# Patient Record
Sex: Male | Born: 1990 | Race: Black or African American | Hispanic: No | Marital: Single | Smoking: Never smoker
Health system: Southern US, Community
[De-identification: ages and names within clinical notes are randomized; demographics above are authoritative.]

## PROBLEM LIST (undated history)

## (undated) DIAGNOSIS — N319 Neuromuscular dysfunction of bladder, unspecified: Secondary | ICD-10-CM

## (undated) DIAGNOSIS — R339 Retention of urine, unspecified: Secondary | ICD-10-CM

## (undated) DIAGNOSIS — G8929 Other chronic pain: Secondary | ICD-10-CM

## (undated) DIAGNOSIS — I10 Essential (primary) hypertension: Secondary | ICD-10-CM

## (undated) DIAGNOSIS — K592 Neurogenic bowel, not elsewhere classified: Secondary | ICD-10-CM

## (undated) DIAGNOSIS — Q059 Spina bifida, unspecified: Secondary | ICD-10-CM

## (undated) DIAGNOSIS — M549 Dorsalgia, unspecified: Secondary | ICD-10-CM

## (undated) HISTORY — PX: CSF SHUNT: SHX92

## (undated) HISTORY — PX: BACK SURGERY: SHX140

---
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
## 2008-03-15 ENCOUNTER — Emergency Department (HOSPITAL_COMMUNITY): Admission: EM | Admit: 2008-03-15 | Discharge: 2008-03-15 | Payer: Self-pay | Admitting: Emergency Medicine

## 2008-06-11 ENCOUNTER — Emergency Department (HOSPITAL_BASED_OUTPATIENT_CLINIC_OR_DEPARTMENT_OTHER): Admission: EM | Admit: 2008-06-11 | Discharge: 2008-06-11 | Payer: Self-pay | Admitting: Emergency Medicine

## 2011-07-04 LAB — DIFFERENTIAL
Eosinophils Absolute: 0
Eosinophils Relative: 0
Lymphocytes Relative: 18
Lymphs Abs: 1.2
Monocytes Absolute: 1.3 — ABNORMAL HIGH
Monocytes Relative: 20 — ABNORMAL HIGH

## 2011-07-04 LAB — CBC
HCT: 41.6
Hemoglobin: 13.9
MCV: 82.7
RBC: 5.03
WBC: 6.6

## 2011-07-04 LAB — POCT I-STAT, CHEM 8
BUN: 9
Creatinine, Ser: 1.4
Glucose, Bld: 88
Hemoglobin: 15.3
TCO2: 25

## 2011-07-10 LAB — DIFFERENTIAL
Basophils Absolute: 0.6 — ABNORMAL HIGH
Basophils Relative: 4 — ABNORMAL HIGH
Eosinophils Absolute: 0
Eosinophils Relative: 0
Lymphocytes Relative: 4 — ABNORMAL LOW
Lymphs Abs: 0.6 — ABNORMAL LOW
Monocytes Absolute: 0.9
Monocytes Relative: 6
Neutro Abs: 12.6 — ABNORMAL HIGH
Neutrophils Relative %: 86 — ABNORMAL HIGH

## 2011-07-10 LAB — CBC
HCT: 39.3
Hemoglobin: 13.8
MCHC: 35
MCV: 80.7
Platelets: 229
RBC: 4.87
RDW: 13.1
WBC: 14.7 — ABNORMAL HIGH

## 2011-07-10 LAB — URINALYSIS, ROUTINE W REFLEX MICROSCOPIC
Glucose, UA: NEGATIVE
Ketones, ur: 15 — AB
Nitrite: NEGATIVE
Protein, ur: 100 — AB
Specific Gravity, Urine: 1.036 — ABNORMAL HIGH
Urobilinogen, UA: 1
pH: 6

## 2011-07-10 LAB — URINE MICROSCOPIC-ADD ON

## 2011-07-10 LAB — URINE CULTURE: Colony Count: >=100000

## 2011-07-10 LAB — POCT B-TYPE NATRIURETIC PEPTIDE (BNP): B Natriuretic Peptide, POC: <5

## 2017-07-18 ENCOUNTER — Inpatient Hospital Stay (HOSPITAL_BASED_OUTPATIENT_CLINIC_OR_DEPARTMENT_OTHER)
Admission: EM | Admit: 2017-07-18 | Discharge: 2017-07-22 | DRG: 872 | Disposition: A | Discharge status: Home / Self Care | Payer: PRIVATE HEALTH INSURANCE | Attending: Internal Medicine | Admitting: Internal Medicine

## 2017-07-18 ENCOUNTER — Emergency Department (HOSPITAL_BASED_OUTPATIENT_CLINIC_OR_DEPARTMENT_OTHER): Payer: PRIVATE HEALTH INSURANCE

## 2017-07-18 ENCOUNTER — Encounter (HOSPITAL_BASED_OUTPATIENT_CLINIC_OR_DEPARTMENT_OTHER): Payer: Self-pay | Admitting: *Deleted

## 2017-07-18 DIAGNOSIS — N390 Urinary tract infection, site not specified: Secondary | ICD-10-CM | POA: Diagnosis present

## 2017-07-18 DIAGNOSIS — Z982 Presence of cerebrospinal fluid drainage device: Secondary | ICD-10-CM

## 2017-07-18 DIAGNOSIS — I10 Essential (primary) hypertension: Secondary | ICD-10-CM | POA: Diagnosis present

## 2017-07-18 DIAGNOSIS — A4151 Sepsis due to Escherichia coli [E. coli]: Principal | ICD-10-CM | POA: Diagnosis present

## 2017-07-18 DIAGNOSIS — Z87728 Personal history of other specified (corrected) congenital malformations of nervous system and sense organs: Secondary | ICD-10-CM

## 2017-07-18 DIAGNOSIS — R51 Headache: Secondary | ICD-10-CM | POA: Diagnosis present

## 2017-07-18 DIAGNOSIS — N319 Neuromuscular dysfunction of bladder, unspecified: Secondary | ICD-10-CM | POA: Diagnosis present

## 2017-07-18 DIAGNOSIS — M545 Low back pain: Secondary | ICD-10-CM | POA: Diagnosis present

## 2017-07-18 DIAGNOSIS — N529 Male erectile dysfunction, unspecified: Secondary | ICD-10-CM | POA: Diagnosis present

## 2017-07-18 DIAGNOSIS — Z91040 Latex allergy status: Secondary | ICD-10-CM

## 2017-07-18 DIAGNOSIS — H538 Other visual disturbances: Secondary | ICD-10-CM | POA: Diagnosis present

## 2017-07-18 DIAGNOSIS — Z79899 Other long term (current) drug therapy: Secondary | ICD-10-CM

## 2017-07-18 DIAGNOSIS — G8929 Other chronic pain: Secondary | ICD-10-CM | POA: Diagnosis present

## 2017-07-18 DIAGNOSIS — Z833 Family history of diabetes mellitus: Secondary | ICD-10-CM

## 2017-07-18 DIAGNOSIS — R42 Dizziness and giddiness: Secondary | ICD-10-CM | POA: Diagnosis present

## 2017-07-18 DIAGNOSIS — Z886 Allergy status to analgesic agent status: Secondary | ICD-10-CM

## 2017-07-18 DIAGNOSIS — Q0701 Arnold-Chiari syndrome with spina bifida: Secondary | ICD-10-CM

## 2017-07-18 DIAGNOSIS — A419 Sepsis, unspecified organism: Secondary | ICD-10-CM

## 2017-07-18 HISTORY — DX: Essential (primary) hypertension: I10

## 2017-07-18 HISTORY — DX: Neurogenic bowel, not elsewhere classified: K59.2

## 2017-07-18 HISTORY — DX: Dorsalgia, unspecified: M54.9

## 2017-07-18 HISTORY — DX: Spina bifida, unspecified: Q05.9

## 2017-07-18 HISTORY — DX: Other chronic pain: G89.29

## 2017-07-18 HISTORY — DX: Retention of urine, unspecified: R33.9

## 2017-07-18 HISTORY — DX: Neuromuscular dysfunction of bladder, unspecified: N31.9

## 2017-07-18 LAB — URINALYSIS, ROUTINE W REFLEX MICROSCOPIC
BILIRUBIN URINE: NEGATIVE
Glucose, UA: NEGATIVE mg/dL
Ketones, ur: >80 mg/dL — AB
Nitrite: POSITIVE — AB
Protein, ur: NEGATIVE mg/dL
SPECIFIC GRAVITY, URINE: 1.015 (ref 1.005–1.030)
pH: 8 (ref 5.0–8.0)

## 2017-07-18 LAB — CBC WITH DIFFERENTIAL/PLATELET
Basophils Absolute: 0 10*3/uL (ref 0.0–0.1)
Basophils Relative: 0 %
EOS ABS: 0.1 10*3/uL (ref 0.0–0.7)
EOS PCT: 1 %
HCT: 38.9 % — ABNORMAL LOW (ref 39.0–52.0)
Hemoglobin: 13.1 g/dL (ref 13.0–17.0)
LYMPHS ABS: 0.6 10*3/uL — AB (ref 0.7–4.0)
Lymphocytes Relative: 4 %
MCH: 27.4 pg (ref 26.0–34.0)
MCHC: 33.7 g/dL (ref 30.0–36.0)
MCV: 81.4 fL (ref 78.0–100.0)
MONO ABS: 1 10*3/uL (ref 0.1–1.0)
MONOS PCT: 7 %
Neutro Abs: 12.8 10*3/uL — ABNORMAL HIGH (ref 1.7–7.7)
Neutrophils Relative %: 88 %
PLATELETS: 252 10*3/uL (ref 150–400)
RBC: 4.78 MIL/uL (ref 4.22–5.81)
RDW: 14.9 % (ref 11.5–15.5)
WBC: 14.4 10*3/uL — AB (ref 4.0–10.5)

## 2017-07-18 LAB — URINALYSIS, MICROSCOPIC (REFLEX)

## 2017-07-18 LAB — COMPREHENSIVE METABOLIC PANEL
ALT: 18 U/L (ref 17–63)
ANION GAP: 8 (ref 5–15)
AST: 22 U/L (ref 15–41)
Albumin: 4.1 g/dL (ref 3.5–5.0)
Alkaline Phosphatase: 92 U/L (ref 38–126)
BUN: 12 mg/dL (ref 6–20)
CHLORIDE: 111 mmol/L (ref 101–111)
CO2: 18 mmol/L — ABNORMAL LOW (ref 22–32)
CREATININE: 1.17 mg/dL (ref 0.61–1.24)
Calcium: 9.2 mg/dL (ref 8.9–10.3)
Glucose, Bld: 98 mg/dL (ref 65–99)
POTASSIUM: 3.5 mmol/L (ref 3.5–5.1)
Sodium: 137 mmol/L (ref 135–145)
Total Bilirubin: 1.2 mg/dL (ref 0.3–1.2)
Total Protein: 8.2 g/dL — ABNORMAL HIGH (ref 6.5–8.1)

## 2017-07-18 LAB — PROTIME-INR
INR: 1.19
PROTHROMBIN TIME: 15 s (ref 11.4–15.2)

## 2017-07-18 LAB — I-STAT CG4 LACTIC ACID, ED: LACTIC ACID, VENOUS: 1.6 mmol/L (ref 0.5–1.9)

## 2017-07-18 MED ORDER — SODIUM CHLORIDE 0.9 % IV BOLUS (SEPSIS)
30.0000 mL/kg | Freq: Once | INTRAVENOUS | Status: AC
  Administered 2017-07-18: 2586 mL via INTRAVENOUS

## 2017-07-18 MED ORDER — PIPERACILLIN-TAZOBACTAM 3.375 G IVPB 30 MIN
3.3750 g | Freq: Once | INTRAVENOUS | Status: AC
  Administered 2017-07-18: 3.375 g via INTRAVENOUS
  Filled 2017-07-18 (×2): qty 50

## 2017-07-18 MED ORDER — VANCOMYCIN HCL IN DEXTROSE 1-5 GM/200ML-% IV SOLN
1000.0000 mg | Freq: Once | INTRAVENOUS | Status: AC
Start: 2017-07-18 — End: 2017-07-19
  Administered 2017-07-18: 1000 mg via INTRAVENOUS
  Filled 2017-07-18: qty 200

## 2017-07-18 NOTE — ED Triage Notes (Signed)
Pt c/o fever chills and "UTI" x 1 day  HX spina bifida

## 2017-07-19 ENCOUNTER — Inpatient Hospital Stay (HOSPITAL_COMMUNITY): Payer: PRIVATE HEALTH INSURANCE

## 2017-07-19 ENCOUNTER — Encounter (HOSPITAL_COMMUNITY): Payer: Self-pay | Admitting: Internal Medicine

## 2017-07-19 DIAGNOSIS — N390 Urinary tract infection, site not specified: Secondary | ICD-10-CM

## 2017-07-19 DIAGNOSIS — H538 Other visual disturbances: Secondary | ICD-10-CM | POA: Diagnosis present

## 2017-07-19 DIAGNOSIS — Z833 Family history of diabetes mellitus: Secondary | ICD-10-CM | POA: Diagnosis not present

## 2017-07-19 DIAGNOSIS — A4151 Sepsis due to Escherichia coli [E. coli]: Secondary | ICD-10-CM | POA: Diagnosis present

## 2017-07-19 DIAGNOSIS — N529 Male erectile dysfunction, unspecified: Secondary | ICD-10-CM | POA: Diagnosis present

## 2017-07-19 DIAGNOSIS — Q0701 Arnold-Chiari syndrome with spina bifida: Secondary | ICD-10-CM | POA: Diagnosis not present

## 2017-07-19 DIAGNOSIS — Z79899 Other long term (current) drug therapy: Secondary | ICD-10-CM | POA: Diagnosis not present

## 2017-07-19 DIAGNOSIS — G8929 Other chronic pain: Secondary | ICD-10-CM | POA: Diagnosis present

## 2017-07-19 DIAGNOSIS — R51 Headache: Secondary | ICD-10-CM | POA: Diagnosis present

## 2017-07-19 DIAGNOSIS — A419 Sepsis, unspecified organism: Secondary | ICD-10-CM | POA: Diagnosis not present

## 2017-07-19 DIAGNOSIS — Z886 Allergy status to analgesic agent status: Secondary | ICD-10-CM | POA: Diagnosis not present

## 2017-07-19 DIAGNOSIS — Z982 Presence of cerebrospinal fluid drainage device: Secondary | ICD-10-CM | POA: Diagnosis not present

## 2017-07-19 DIAGNOSIS — Z87728 Personal history of other specified (corrected) congenital malformations of nervous system and sense organs: Secondary | ICD-10-CM | POA: Diagnosis not present

## 2017-07-19 DIAGNOSIS — N319 Neuromuscular dysfunction of bladder, unspecified: Secondary | ICD-10-CM | POA: Diagnosis present

## 2017-07-19 DIAGNOSIS — I10 Essential (primary) hypertension: Secondary | ICD-10-CM | POA: Diagnosis present

## 2017-07-19 DIAGNOSIS — R42 Dizziness and giddiness: Secondary | ICD-10-CM | POA: Diagnosis present

## 2017-07-19 DIAGNOSIS — M545 Low back pain: Secondary | ICD-10-CM | POA: Diagnosis present

## 2017-07-19 DIAGNOSIS — Z91040 Latex allergy status: Secondary | ICD-10-CM | POA: Diagnosis not present

## 2017-07-19 MED ORDER — CELECOXIB 100 MG PO CAPS
100.0000 mg | ORAL_CAPSULE | Freq: Two times a day (BID) | ORAL | Status: DC
  Administered 2017-07-19 – 2017-07-20 (×3): 100 mg via ORAL
  Filled 2017-07-19 (×5): qty 1

## 2017-07-19 MED ORDER — BACLOFEN 20 MG PO TABS
20.0000 mg | ORAL_TABLET | Freq: Two times a day (BID) | ORAL | Status: DC
  Administered 2017-07-19 – 2017-07-22 (×6): 20 mg via ORAL
  Filled 2017-07-19 (×6): qty 1

## 2017-07-19 MED ORDER — HYDROMORPHONE HCL 1 MG/ML IJ SOLN
1.0000 mg | INTRAMUSCULAR | Status: DC | PRN
  Administered 2017-07-19: 2 mg via INTRAVENOUS
  Filled 2017-07-19: qty 2

## 2017-07-19 MED ORDER — LISINOPRIL 20 MG PO TABS
20.0000 mg | ORAL_TABLET | Freq: Every day | ORAL | Status: DC
  Administered 2017-07-19 – 2017-07-22 (×4): 20 mg via ORAL
  Filled 2017-07-19 (×4): qty 1

## 2017-07-19 MED ORDER — FENTANYL CITRATE (PF) 100 MCG/2ML IJ SOLN
50.0000 ug | INTRAMUSCULAR | Status: DC | PRN
  Administered 2017-07-19 – 2017-07-20 (×3): 50 ug via INTRAVENOUS
  Filled 2017-07-19 (×3): qty 2

## 2017-07-19 MED ORDER — PREGABALIN 75 MG PO CAPS
75.0000 mg | ORAL_CAPSULE | Freq: Two times a day (BID) | ORAL | Status: DC
  Administered 2017-07-19: 75 mg via ORAL
  Filled 2017-07-19: qty 1

## 2017-07-19 MED ORDER — BACLOFEN 20 MG PO TABS
20.0000 mg | ORAL_TABLET | Freq: Three times a day (TID) | ORAL | Status: DC
  Administered 2017-07-19: 20 mg via ORAL
  Filled 2017-07-19: qty 1

## 2017-07-19 MED ORDER — FENTANYL CITRATE (PF) 100 MCG/2ML IJ SOLN
50.0000 ug | Freq: Once | INTRAMUSCULAR | Status: AC
  Administered 2017-07-19: 50 ug via INTRAVENOUS

## 2017-07-19 MED ORDER — ACETAMINOPHEN 325 MG PO TABS: 650.0000 mg | ORAL_TABLET | Freq: Four times a day (QID) | ORAL | Status: DC | PRN

## 2017-07-19 MED ORDER — OXYBUTYNIN CHLORIDE ER 5 MG PO TB24: 15.0000 mg | ORAL_TABLET | Freq: Every day | ORAL | Status: DC

## 2017-07-19 MED ORDER — VANCOMYCIN HCL IN DEXTROSE 1-5 GM/200ML-% IV SOLN
1000.0000 mg | Freq: Two times a day (BID) | INTRAVENOUS | Status: DC
  Administered 2017-07-19 – 2017-07-21 (×5): 1000 mg via INTRAVENOUS
  Filled 2017-07-19 (×4): qty 200

## 2017-07-19 MED ORDER — SACCHAROMYCES BOULARDII 250 MG PO CAPS
250.0000 mg | ORAL_CAPSULE | Freq: Two times a day (BID) | ORAL | Status: DC
  Administered 2017-07-19 – 2017-07-22 (×7): 250 mg via ORAL
  Filled 2017-07-19 (×7): qty 1

## 2017-07-19 MED ORDER — SODIUM CHLORIDE 0.9 % IV SOLN
INTRAVENOUS | Status: AC
  Administered 2017-07-19 – 2017-07-20 (×3): via INTRAVENOUS

## 2017-07-19 MED ORDER — TOPIRAMATE 25 MG PO TABS
50.0000 mg | ORAL_TABLET | Freq: Two times a day (BID) | ORAL | Status: DC
  Administered 2017-07-19 – 2017-07-22 (×7): 50 mg via ORAL
  Filled 2017-07-19 (×7): qty 2

## 2017-07-19 MED ORDER — POLYETHYLENE GLYCOL 3350 17 G PO PACK: 17.0000 g | PACK | Freq: Every day | ORAL | Status: DC | PRN

## 2017-07-19 MED ORDER — HYDROMORPHONE HCL 1 MG/ML IJ SOLN
1.0000 mg | INTRAMUSCULAR | Status: DC | PRN
  Administered 2017-07-19 (×2): 1 mg via INTRAVENOUS
  Filled 2017-07-19 (×2): qty 1

## 2017-07-19 MED ORDER — PIPERACILLIN-TAZOBACTAM 3.375 G IVPB
3.3750 g | Freq: Three times a day (TID) | INTRAVENOUS | Status: DC
  Administered 2017-07-19 – 2017-07-22 (×10): 3.375 g via INTRAVENOUS
  Filled 2017-07-19 (×10): qty 50

## 2017-07-19 MED ORDER — DEXTROSE 5 % IV SOLN: 1.0000 g | INTRAVENOUS | Status: DC

## 2017-07-19 MED ORDER — DIPHENHYDRAMINE HCL 50 MG/ML IJ SOLN: 25.0000 mg | Freq: Four times a day (QID) | INTRAMUSCULAR | Status: DC | PRN

## 2017-07-19 MED ORDER — ACETAMINOPHEN 325 MG PO TABS
ORAL_TABLET | ORAL | Status: AC
Start: 2017-07-19 — End: 2017-07-19
  Filled 2017-07-19: qty 2

## 2017-07-19 MED ORDER — FENTANYL CITRATE (PF) 100 MCG/2ML IJ SOLN
50.0000 ug | Freq: Once | INTRAMUSCULAR | Status: AC
  Administered 2017-07-19: 50 ug via INTRAVENOUS
  Filled 2017-07-19: qty 2

## 2017-07-19 MED ORDER — ACETAMINOPHEN 650 MG RE SUPP: 650.0000 mg | Freq: Four times a day (QID) | RECTAL | Status: DC | PRN

## 2017-07-19 MED ORDER — FENTANYL CITRATE (PF) 100 MCG/2ML IJ SOLN
INTRAMUSCULAR | Status: AC
  Filled 2017-07-19: qty 2

## 2017-07-19 MED ORDER — PREGABALIN 75 MG PO CAPS
75.0000 mg | ORAL_CAPSULE | Freq: Three times a day (TID) | ORAL | Status: DC
  Administered 2017-07-19 – 2017-07-22 (×9): 75 mg via ORAL
  Filled 2017-07-19 (×9): qty 1

## 2017-07-19 MED ORDER — DULOXETINE HCL 60 MG PO CPEP
60.0000 mg | ORAL_CAPSULE | Freq: Every day | ORAL | Status: DC
  Administered 2017-07-19 – 2017-07-22 (×4): 60 mg via ORAL
  Filled 2017-07-19 (×4): qty 1

## 2017-07-19 MED ORDER — ONDANSETRON HCL 4 MG/2ML IJ SOLN
4.0000 mg | Freq: Four times a day (QID) | INTRAMUSCULAR | Status: DC | PRN
  Administered 2017-07-19: 4 mg via INTRAVENOUS
  Filled 2017-07-19: qty 2

## 2017-07-19 MED ORDER — ENOXAPARIN SODIUM 40 MG/0.4ML ~~LOC~~ SOLN
40.0000 mg | SUBCUTANEOUS | Status: DC
  Administered 2017-07-19 – 2017-07-22 (×4): 40 mg via SUBCUTANEOUS
  Filled 2017-07-19 (×4): qty 0.4

## 2017-07-19 MED ORDER — OXYBUTYNIN CHLORIDE ER 5 MG PO TB24
15.0000 mg | ORAL_TABLET | Freq: Every day | ORAL | Status: DC
  Administered 2017-07-19 – 2017-07-22 (×4): 15 mg via ORAL
  Filled 2017-07-19 (×4): qty 3

## 2017-07-19 MED ORDER — ACETAMINOPHEN 325 MG PO TABS
650.0000 mg | ORAL_TABLET | Freq: Once | ORAL | Status: AC
  Administered 2017-07-19: 650 mg via ORAL

## 2017-07-19 NOTE — Progress Notes (Signed)
Patient ID: Willie Cherry, male   DOB: October 14, 1990, 26 y.o.   MRN: [id number]  PROGRESS NOTE    Willie Cherry  YQM:[id number] DOB: 1991-01-31 DOA: 07/18/2017  PCP: System, Pcp Not In   Brief Narrative:  26 year old male with spina bifida, neurogenic bladder, chronic back pain. Pt presented to ED with fever and found to have UTI. He was started on empiric abx while awaiting culture results.   Assessment & Plan:   Active Problems: Sepsis due to UTI  Secondary to neurogenic bladder / Leukocytosis - Sepsis criteria met on admission with fever, tachycardia, tachypnea, leukocytosis - Lactic acid is WNL - UA showed leukocytes - CXR showed no acute cardiopulmonary findings  - Blood cultures and urine culture ordered - Started broad spectrum antibiotics: vanco and zosyn   Headaches - Will order MRI brain since he has fever and reported ongoing pounding headache, blurry vision and dizziness   Essential hypertension - Continue lisinopril   DVT prophylaxis: Lovenox subQ Code Status: full code  Family Communication: father at the bedside  Disposition Plan: home once sepsis etiology resolves   Consultants:   None   Procedures:   None   Antimicrobials:   Vanco and zosyn 10/12 -->   Subjective: Has headaches.  Objective: Vitals:   07/19/17 0346 07/19/17 0503 07/19/17 0639 07/19/17 1008  BP:  (!) 156/85    Pulse:  (!) 106    Resp:  20    Temp: (!) 103.1 F (39.5 C) (!) 100.6 F (38.1 C)  99.5 F (37.5 C)  TempSrc: Oral Oral  Oral  SpO2:  99%    Weight:   74.2 kg (163 lb 9.3 oz)   Height:   '5\' 2"'  (1.575 m)     Intake/Output Summary (Last 24 hours) at 07/19/17 1246 Last data filed at 07/19/17 0239  Gross per 24 hour  Intake             2836 ml  Output                0 ml  Net             2836 ml   Filed Weights   07/18/17 2239 07/19/17 0639  Weight: 86.2 kg (190 lb) 74.2 kg (163 lb 9.3 oz)    Examination:  General exam: Appears calm and comfortable    Respiratory system: Clear to auscultation. Respiratory effort normal. Cardiovascular system: S1 & S2 heard, RRR. No JVD, murmurs, rubs, gallops or clicks. No pedal edema. Gastrointestinal system: Abdomen is nondistended, soft and nontender. No organomegaly or masses felt. Normal bowel sounds heard. Central nervous system: Alert and oriented. No focal neurological deficits. Extremities: Not able to lift legs up but can move them slightly from side to side Skin: No rashes, lesions or ulcers Psychiatry: Judgement and insight appear normal. Mood & affect appropriate.   Data Reviewed: I have personally reviewed following labs and imaging studies  CBC:  Recent Labs Lab 07/18/17 2300  WBC 14.4*  NEUTROABS 12.8*  HGB 13.1  HCT 38.9*  MCV 81.4  PLT 889   Basic Metabolic Panel:  Recent Labs Lab 07/18/17 2318  NA 137  K 3.5  CL 111  CO2 18*  GLUCOSE 98  BUN 12  CREATININE 1.17  CALCIUM 9.2   GFR: Estimated Creatinine Clearance: 83 mL/min (by C-G formula based on SCr of 1.17 mg/dL). Liver Function Tests:  Recent Labs Lab 07/18/17 2318  AST 22  ALT 18  ALKPHOS 92  BILITOT 1.2  PROT 8.2*  ALBUMIN 4.1   No results for input(s): LIPASE, AMYLASE in the last 168 hours. No results for input(s): AMMONIA in the last 168 hours. Coagulation Profile:  Recent Labs Lab 07/18/17 2318  INR 1.19   Cardiac Enzymes: No results for input(s): CKTOTAL, CKMB, CKMBINDEX, TROPONINI in the last 168 hours. BNP (last 3 results) No results for input(s): PROBNP in the last 8760 hours. HbA1C: No results for input(s): HGBA1C in the last 72 hours. CBG: No results for input(s): GLUCAP in the last 168 hours. Lipid Profile: No results for input(s): CHOL, HDL, LDLCALC, TRIG, CHOLHDL, LDLDIRECT in the last 72 hours. Thyroid Function Tests: No results for input(s): TSH, T4TOTAL, FREET4, T3FREE, THYROIDAB in the last 72 hours. Anemia Panel: No results for input(s): VITAMINB12, FOLATE, FERRITIN,  TIBC, IRON, RETICCTPCT in the last 72 hours. Urine analysis:    Component Value Date/Time   COLORURINE YELLOW 07/18/2017 2329   APPEARANCEUR CLOUDY (A) 07/18/2017 2329   LABSPEC 1.015 07/18/2017 2329   PHURINE 8.0 07/18/2017 2329   GLUCOSEU NEGATIVE 07/18/2017 2329   HGBUR TRACE (A) 07/18/2017 2329   BILIRUBINUR NEGATIVE 07/18/2017 2329   KETONESUR >80 (A) 07/18/2017 2329   PROTEINUR NEGATIVE 07/18/2017 2329   UROBILINOGEN 1.0 06/11/2008 1000   NITRITE POSITIVE (A) 07/18/2017 2329   LEUKOCYTESUR TRACE (A) 07/18/2017 2329   Sepsis Labs: '@LABRCNTIP' (procalcitonin:4,lacticidven:4)   )No results found for this or any previous visit (from the past 240 hour(s)).    Radiology Studies: Dg Chest 2 View  Result Date: 07/19/2017 CLINICAL DATA:  Low back pain.  Urinary retention. EXAM: CHEST  2 VIEW COMPARISON:  03/15/2008 FINDINGS: Shallow inspiration. The lungs are clear. Pulmonary vasculature is normal. No pleural effusions. Hilar, mediastinal and cardiac contours are unremarkable. Tubing superimposes the left hemithorax, likely ventriculoperitoneal shunt. IMPRESSION: No active cardiopulmonary disease. Electronically Signed   By: Andreas Newport M.D.   On: 07/19/2017 00:48      Scheduled Meds: . baclofen  20 mg Oral BID  . celecoxib  100 mg Oral BID  . DULoxetine  60 mg Oral Daily  . enoxaparin (LOVENOX) injection  40 mg Subcutaneous Q24H  . lisinopril  20 mg Oral Daily  . oxybutynin  15 mg Oral Daily  . pregabalin  75 mg Oral TID  . saccharomyces boulardii  250 mg Oral BID  . topiramate  50 mg Oral BID   Continuous Infusions: . sodium chloride 100 mL/hr at 07/19/17 0641  . piperacillin-tazobactam (ZOSYN)  IV 3.375 g (07/19/17 0908)  . vancomycin 1,000 mg (07/19/17 1145)     LOS: 0 days    Time spent: 25 minutes  Greater than 50% of the time spent on counseling and coordinating the care.   Leisa Lenz, MD [facility] Pager [phone]  If 7PM-7AM, please  contact night-coverage [email or web address] Password [email or web address] 07/19/2017, 12:46 PM

## 2017-07-19 NOTE — H&P (Addendum)
TRH H&P   Patient Demographics:    Willie Cherry, is a 26 y.o. male  MRN: [id number]   DOB - [birth year]  Admit Date - 07/18/2017  Outpatient Primary MD for the patient is System, Pcp Not In [place] [facility] (pcp)   Referring MD/NP/PA: Tilden Fossa  Outpatient Specialists:  Dr. Wyline Mood  (urology) Dr. Conley Canal (neurology)  Patient coming from: home  Chief Complaint  Patient presents with  . Fever      HPI:    Willie Cherry  is a 26 y.o. male, w spina bifida, neurogenic bladder, chronic back pain, was having dysuria yesterday and therefore presented to ED, + fever.  Denies n/v, flank pain, hematuria.    In ED,  Wbc 14.4 Bun 12, Creatinine 1.17 ua wbc 6-30, many bacteria.   Pt was Febrile, tachycardic, Wbc 14.4, pt will be admitted for sepsis, likely secondary to UTI   Review of systems:    In addition to the HPI above,  No Fever-chills, No Headache, No changes with Vision or hearing, No problems swallowing food or Liquids, No Chest pain, Cough or Shortness of Breath, No Abdominal pain, No Nausea or Vommitting, Bowel movements are regular, No Blood in stool or Urine,  No new skin rashes or bruises, No new joints pains-aches,  No new weakness, tingling, numbness in any extremity, No recent weight gain or loss, No polyuria, polydypsia or polyphagia, No significant Mental Stressors.  A full 10 point Review of Systems was done, except as stated above, all other Review of Systems were negative.   With Past History of the following :    Past Medical History:  Diagnosis Date  . Chronic back pain   . Hypertension   . Neurogenic bladder   . Neurogenic bowel   . SB (spina bifida) (HCC)   . Urinary retention       Past Surgical History:  Procedure Laterality Date  . BACK SURGERY    . CSF SHUNT        Social History:     Social History  Substance  Use Topics  . Smoking status: Never Smoker  . Smokeless tobacco: Never Used  . Alcohol use No     Lives - at home  Mobility - walks with AFO   Family History :     Family History  Problem Relation Age of Onset  . Diabetes Father       Home Medications:   Prior to Admission medications   Medication Sig Start Date End Date Taking? Authorizing Provider  baclofen (LIORESAL) 20 MG tablet Take 20 mg by mouth 3 (three) times daily.   Yes [provider]  celecoxib (CELEBREX) 100 MG capsule Take 100 mg by mouth 2 (two) times daily.   Yes [provider]  lisinopril (PRINIVIL,ZESTRIL) 20 MG tablet Take 20 mg by mouth daily.   Yes [provider]  oxybutynin (DITROPAN XL) 15 MG 24 hr tablet Take 15 mg by mouth at bedtime.   Yes [provider]  pregabalin (LYRICA) 75 MG capsule Take 75 mg by mouth 2 (two) times daily.   Yes [provider]  sildenafil (VIAGRA) 50 MG tablet Take 50 mg by mouth daily as needed for erectile dysfunction.   Yes [provider]     Allergies:     Allergies  Allergen Reactions  . Ibuprofen     Pt states he cannot take this because it interacts adversely with his regular meds.   . Latex   . Tylenol [Acetaminophen]     Pt states he cannot take this because it interacts adversely with his regular meds.      Physical Exam:   Vitals  Blood pressure (!) 156/85, pulse (!) 106, temperature (!) 100.6 F (38.1 C), temperature source Oral, resp. rate 20, weight 86.2 kg (190 lb), SpO2 99 %.   1. General  lying in bed in NAD,    2. Normal affect and insight, Not Suicidal or Homicidal, Awake Alert, Oriented X 3.  3. No F.N deficits, ALL C.Nerves Intact, Strength 5/5 all 4 extremities, Sensation intact all 4 extremities, Plantars down going.  4. Ears and Eyes appear Normal, Conjunctivae clear, PERRLA. Moist Oral Mucosa.  5. Supple Neck, No JVD, No cervical lymphadenopathy appriciated, No Carotid  Bruits.  6. Symmetrical Chest wall movement, Good air movement bilaterally, CTAB.  7. RRR, No Gallops, Rubs or Murmurs, No Parasternal Heave.  8. Positive Bowel Sounds, Abdomen Soft, No tenderness, No organomegaly appriciated,No rebound -guarding or rigidity.  9.  No Cyanosis, Normal Skin Turgor, No Skin Rash or Bruise.  10. Good muscle tone,  joints appear normal , no effusions, Normal ROM.  11. No Palpable Lymph Nodes in Neck or Axillae     Data Review:    CBC  Recent Labs Lab 07/18/17 2300  WBC 14.4*  HGB 13.1  HCT 38.9*  PLT 252  MCV 81.4  MCH 27.4  MCHC 33.7  RDW 14.9  LYMPHSABS 0.6*  MONOABS 1.0  EOSABS 0.1  BASOSABS 0.0   ------------------------------------------------------------------------------------------------------------------  Chemistries   Recent Labs Lab 07/18/17 2318  NA 137  K 3.5  CL 111  CO2 18*  GLUCOSE 98  BUN 12  CREATININE 1.17  CALCIUM 9.2  AST 22  ALT 18  ALKPHOS 92  BILITOT 1.2   ------------------------------------------------------------------------------------------------------------------ CrCl cannot be calculated (Unknown ideal weight.). ------------------------------------------------------------------------------------------------------------------ No results for input(s): TSH, T4TOTAL, T3FREE, THYROIDAB in the last 72 hours.  Invalid input(s): FREET3  Coagulation profile  Recent Labs Lab 07/18/17 2318  INR 1.19   ------------------------------------------------------------------------------------------------------------------- No results for input(s): DDIMER in the last 72 hours. -------------------------------------------------------------------------------------------------------------------  Cardiac Enzymes No results for input(s): CKMB, TROPONINI, MYOGLOBIN in the last 168 hours.  Invalid input(s):  CK ------------------------------------------------------------------------------------------------------------------ No results found for: BNP   ---------------------------------------------------------------------------------------------------------------  Urinalysis    Component Value Date/Time   COLORURINE YELLOW 07/18/2017 2329   APPEARANCEUR CLOUDY (A) 07/18/2017 2329   LABSPEC 1.015 07/18/2017 2329   PHURINE 8.0 07/18/2017 2329   GLUCOSEU NEGATIVE 07/18/2017 2329   HGBUR TRACE (A) 07/18/2017 2329   BILIRUBINUR NEGATIVE 07/18/2017 2329   KETONESUR >80 (A) 07/18/2017 2329   PROTEINUR NEGATIVE 07/18/2017 2329   UROBILINOGEN 1.0 06/11/2008 1000   NITRITE POSITIVE (A) 07/18/2017 2329   LEUKOCYTESUR TRACE (A) 07/18/2017 2329    ----------------------------------------------------------------------------------------------------------------   Imaging Results:    Dg Chest 2 View  Result Date:  07/19/2017 CLINICAL DATA:  Low back pain.  Urinary retention. EXAM: CHEST  2 VIEW COMPARISON:  03/15/2008 FINDINGS: Shallow inspiration. The lungs are clear. Pulmonary vasculature is normal. No pleural effusions. Hilar, mediastinal and cardiac contours are unremarkable. Tubing superimposes the left hemithorax, likely ventriculoperitoneal shunt. IMPRESSION: No active cardiopulmonary disease. Electronically Signed   By: Ellery Plunk M.D.   On: 07/19/2017 00:48     Assessment & Plan:    Active Problems:   Sepsis (HCC)    Sepsis Blood culture x2 pending Hydrate with Ns iv Vanco iv , Zosyn iv pharmacy to dose  UTI Await urine culture See above abx  Fever secondary to UTI Tylenol prn  Hypertension Cont lisinopril  Chronic back pain, spina bifida Cont lyrica, cont baclofen, cont celebrex  Urinary incontinence Cont ditropan.   VP shut, Consider shunt series to see if working   DVT Prophylaxis Lovenox - SCDs  AM Labs Ordered, also please review Full Orders  Family  Communication: Admission, patients condition and plan of care including tests being ordered have been discussed with the patient  who indicate understanding and agree with the plan and Code Status.  Code Status FULL CODE  Likely DC to  home  Condition GUARDED   Consults called:  none  Admission status: inpatient  Time spent in minutes : 45   Pearson Grippe M.D on 07/19/2017 at 5:37 AM  Between 7am to 7pm - Pager - [phone]. After 7pm go to [email or web address] - password [email or web address]  [facility] - Office  [phone]

## 2017-07-19 NOTE — ED Provider Notes (Signed)
[facility] Provider Note   CSN: [id number] Arrival date & time: 07/18/17  2234     History   Chief Complaint Chief Complaint  Patient presents with  . Fever    HPI Willie Cherry is a 26 y.o. male.  The history is provided by the patient and a parent. No language interpreter was used.  Fever     Willie Cherry is a 26 y.o. male who presents to the Emergency Department complaining of fever.  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  He does perform in and out cath at home. He was in his routine state of health until about 9 PM this evening when he developed burning sensation throughout his body and shaking chills. He had associated nausea but no vomiting. He has no known sick contacts. He did try to self cath twice but was unable to get the urine back. He denies any new rashes, shortness of breath, cough, abdominal pain. He has experienced similar symptoms in the past related to urinary tract infection. Past Medical History:  Diagnosis Date  . Chronic back pain   . Hypertension   . Neurogenic bladder   . Neurogenic bowel   . SB (spina bifida) (HCC)   . Urinary retention     Patient Active Problem List   Diagnosis Date Noted  . Sepsis (HCC) 07/19/2017    Past Surgical History:  Procedure Laterality Date  . BACK SURGERY    . CSF SHUNT         Home Medications    Prior to Admission medications   Medication Sig Start Date End Date Taking? Authorizing Provider  baclofen (LIORESAL) 20 MG tablet Take 20 mg by mouth 3 (three) times daily.   Yes [provider]  celecoxib (CELEBREX) 100 MG capsule Take 100 mg by mouth 2 (two) times daily.   Yes [provider]  lisinopril (PRINIVIL,ZESTRIL) 20 MG tablet Take 20 mg by mouth daily.   Yes [provider]  oxybutynin (DITROPAN XL) 15 MG 24 hr tablet Take 15 mg by mouth at bedtime.   Yes [provider]  pregabalin  (LYRICA) 75 MG capsule Take 75 mg by mouth 2 (two) times daily.   Yes [provider]  sildenafil (VIAGRA) 50 MG tablet Take 50 mg by mouth daily as needed for erectile dysfunction.   Yes [provider]    Family History No family history on file.  Social History Social History  Substance Use Topics  . Smoking status: Never Smoker  . Smokeless tobacco: Not on file  . Alcohol use No     Allergies   Ibuprofen; Latex; and Tylenol [acetaminophen]   Review of Systems Review of Systems  Constitutional: Positive for fever.  All other systems reviewed and are negative.    Physical Exam Updated Vital Signs BP (!) 156/85 (BP Location: Right Wrist)   Pulse (!) 106   Temp (!) 100.6 F (38.1 C) (Oral)   Resp 20   Wt 86.2 kg (190 lb)   SpO2 99%   Physical Exam  Constitutional: He is oriented to person, place, and time. He appears well-developed and well-nourished.  HENT:  Head: Normocephalic and atraumatic.  Cardiovascular: Regular rhythm.   No murmur heard. Tachycardiac  Pulmonary/Chest: Effort normal and breath sounds normal. No respiratory distress.  Abdominal: Soft. There is no tenderness. There is no rebound and no guarding.  Musculoskeletal: He exhibits  no edema or tenderness.  Atrophy of calves bilaterally  Neurological: He is alert and oriented to person, place, and time.  Skin: Skin is warm and dry.  Psychiatric: He has a normal mood and affect. His behavior is normal.  Nursing note and vitals reviewed.    ED Treatments / Results  Labs (all labs ordered are listed, but only abnormal results are displayed) Labs Reviewed  URINALYSIS, ROUTINE W REFLEX MICROSCOPIC - Abnormal; Notable for the following:       Result Value   APPearance CLOUDY (*)    Hgb urine dipstick TRACE (*)    Ketones, ur >80 (*)    Nitrite POSITIVE (*)    Leukocytes, UA TRACE (*)    All other components within normal limits  CBC WITH DIFFERENTIAL/PLATELET - Abnormal;  Notable for the following:    WBC 14.4 (*)    HCT 38.9 (*)    Neutro Abs 12.8 (*)    Lymphs Abs 0.6 (*)    All other components within normal limits  COMPREHENSIVE METABOLIC PANEL - Abnormal; Notable for the following:    CO2 18 (*)    Total Protein 8.2 (*)    All other components within normal limits  URINALYSIS, MICROSCOPIC (REFLEX) - Abnormal; Notable for the following:    Bacteria, UA MANY (*)    Squamous Epithelial / LPF 0-5 (*)    All other components within normal limits  CULTURE, BLOOD (ROUTINE X 2)  CULTURE, BLOOD (ROUTINE X 2)  URINE CULTURE  PROTIME-INR  LACTIC ACID, PLASMA  LACTIC ACID, PLASMA  I-STAT CG4 LACTIC ACID, ED    EKG  EKG Interpretation None       Radiology Dg Chest 2 View  Result Date: 07/19/2017 CLINICAL DATA:  Low back pain.  Urinary retention. EXAM: CHEST  2 VIEW COMPARISON:  03/15/2008 FINDINGS: Shallow inspiration. The lungs are clear. Pulmonary vasculature is normal. No pleural effusions. Hilar, mediastinal and cardiac contours are unremarkable. Tubing superimposes the left hemithorax, likely ventriculoperitoneal shunt. IMPRESSION: No active cardiopulmonary disease. Electronically Signed   By: Ellery Plunk M.D.   On: 07/19/2017 00:48    Procedures Procedures (including critical care time)  Medications Ordered in ED Medications  sodium chloride 0.9 % bolus 2,586 mL (0 mL/kg  86.2 kg Intravenous Stopped 07/19/17 0239)  piperacillin-tazobactam (ZOSYN) IVPB 3.375 g (0 g Intravenous Stopped 07/18/17 2359)  vancomycin (VANCOCIN) IVPB 1000 mg/200 mL premix (0 mg Intravenous Stopped 07/19/17 0105)  acetaminophen (TYLENOL) tablet 650 mg (650 mg Oral Given 07/19/17 0138)  fentaNYL (SUBLIMAZE) injection 50 mcg (50 mcg Intravenous Given 07/19/17 0112)  fentaNYL (SUBLIMAZE) injection 50 mcg (50 mcg Intravenous Given 07/19/17 0316)     Initial Impression / Assessment and Plan / ED Course  I have reviewed the triage vital signs and the nursing  notes.  Pertinent labs & imaging results that were available during my care of the patient were reviewed by me and considered in my medical decision making (see chart for details).     Patient with history of spina bifida here for evaluation of chills, burning throughout his body. He has experienced decreased urinary output this evening. UA is consistent with UTI. No evidence of cellulitis or soft tissue infection. No evidence of pneumonia. He was on IV fluids, IV antibiotics for sepsis. Hospitalist consulted for admission for further treatment. Patient updated a findings studies and recommendation for admission and he is in agreement with plan.  Final Clinical Impressions(s) / ED Diagnoses   Final diagnoses:  Acute UTI  Sepsis, due to unspecified organism [facility])    New Prescriptions Current Discharge Medication List       Tilden Fossa, MD 07/19/17 [phone]

## 2017-07-19 NOTE — Progress Notes (Signed)
Pharmacy Antibiotic Note  Willie Cherry is a 26 y.o. male with spina bifida admitted on 07/18/2017 with possible sepsis.  Pharmacy has been consulted for vancomycin and zosyn dosing.  Vancomycin 1g and Zosyn 3.375g given in ED.  Tmax 103 WBC 14.4 SCr 1.17 with CrCl ~95 ml/min/1.59m2 (normalized) Lactate 1.6   Plan: Zosyn 3.375gm IV q8h (4hr extended infusions) Vancomycin 1g IV q12h Check trough at steady state Follow up renal function & cultures  Weight: 190 lb (86.2 kg)  Temp (24hrs), Avg:102.5 F (39.2 C), Min:100.6 F (38.1 C), Max:103.1 F (39.5 C)   Recent Labs Lab 07/18/17 2300 07/18/17 2318 07/18/17 2325  WBC 14.4*  --   --   CREATININE  --  1.17  --   LATICACIDVEN  --   --  1.60    CrCl cannot be calculated (Unknown ideal weight.).    Allergies  Allergen Reactions  . Ibuprofen     Pt states he cannot take this because it interacts adversely with his regular meds.   . Latex   . Tylenol [Acetaminophen]     Pt states he cannot take this because it interacts adversely with his regular meds.     Antimicrobials this admission: 10/13 Vancomycin >> 10/13 Zosyn >>  Dose adjustments this admission:  Microbiology results: 10/12 BCx: sent 10/12 BCx: sent  Thank you for allowing pharmacy to be a part of this patient's care.  Loralee Pacas, PharmD, BCPS Pager: [phone] 07/19/2017 6:30 AM

## 2017-07-20 DIAGNOSIS — A419 Sepsis, unspecified organism: Secondary | ICD-10-CM

## 2017-07-20 LAB — CBC
HCT: 34.7 % — ABNORMAL LOW (ref 39.0–52.0)
HEMOGLOBIN: 11.5 g/dL — AB (ref 13.0–17.0)
MCH: 27.4 pg (ref 26.0–34.0)
MCHC: 33.1 g/dL (ref 30.0–36.0)
MCV: 82.6 fL (ref 78.0–100.0)
Platelets: 195 10*3/uL (ref 150–400)
RBC: 4.2 MIL/uL — ABNORMAL LOW (ref 4.22–5.81)
RDW: 15.5 % (ref 11.5–15.5)
WBC: 22.7 10*3/uL — ABNORMAL HIGH (ref 4.0–10.5)

## 2017-07-20 LAB — COMPREHENSIVE METABOLIC PANEL
ALBUMIN: 3.4 g/dL — AB (ref 3.5–5.0)
ALK PHOS: 93 U/L (ref 38–126)
ALT: 16 U/L — AB (ref 17–63)
ANION GAP: 5 (ref 5–15)
AST: 16 U/L (ref 15–41)
BILIRUBIN TOTAL: 0.9 mg/dL (ref 0.3–1.2)
BUN: 8 mg/dL (ref 6–20)
CALCIUM: 8.1 mg/dL — AB (ref 8.9–10.3)
CO2: 19 mmol/L — AB (ref 22–32)
Chloride: 117 mmol/L — ABNORMAL HIGH (ref 101–111)
Creatinine, Ser: 1.14 mg/dL (ref 0.61–1.24)
GFR calc Af Amer: >60 mL/min (ref >60)
GFR calc non Af Amer: >60 mL/min (ref >60)
GLUCOSE: 109 mg/dL — AB (ref 65–99)
Potassium: 3.7 mmol/L (ref 3.5–5.1)
SODIUM: 141 mmol/L (ref 135–145)
TOTAL PROTEIN: 6.8 g/dL (ref 6.5–8.1)

## 2017-07-20 LAB — HIV ANTIBODY (ROUTINE TESTING W REFLEX): HIV Screen 4th Generation wRfx: NONREACTIVE

## 2017-07-20 MED ORDER — MORPHINE SULFATE (PF) 4 MG/ML IV SOLN
2.0000 mg | INTRAVENOUS | Status: DC | PRN
  Administered 2017-07-20 (×4): 2 mg via INTRAVENOUS
  Filled 2017-07-20 (×4): qty 1

## 2017-07-20 MED ORDER — OXYCODONE HCL 5 MG PO TABS
5.0000 mg | ORAL_TABLET | ORAL | Status: DC | PRN
  Administered 2017-07-20: 5 mg via ORAL
  Administered 2017-07-20: 10 mg via ORAL
  Administered 2017-07-20: 5 mg via ORAL
  Administered 2017-07-21 (×2): 10 mg via ORAL
  Filled 2017-07-20: qty 1
  Filled 2017-07-20 (×2): qty 2
  Filled 2017-07-20: qty 1
  Filled 2017-07-20: qty 2

## 2017-07-20 NOTE — Progress Notes (Signed)
Patient ID: Willie Cherry, male   DOB: [birth year], 26 y.o.   MRN: [id number]  PROGRESS NOTE    Willie Cherry  OVF:[id number] DOB: 12/13/1990 DOA: 07/18/2017  PCP: System, Pcp Not In   Brief Narrative:  26 year old male with spina bifida, neurogenic bladder, chronic back pain. Pt presented to ED with fever and found to have UTI. He was started on empiric abx while awaiting culture results.   Assessment & Plan:   Active Problems: Sepsis due to UTI  Secondary to neurogenic bladder / Leukocytosis - Sepsis criteria met on admission with fever, tachycardia, tachypnea, leukocytosis - Lactic acid is WNL - UA showed leukocytes - No acute cardiopulmonary findings on CXR - Blood cx and urine cx pending - Continue vanco and zosyn - Monitor WBC count  Headaches - MRI brain showed CSF shunt otherwise normal appearence    Essential hypertension - Continue lisinopril   Lower back pain / History of spina bifida - Continue pain management efforts   DVT prophylaxis: Lovenox subQ Code Status: full code  Family Communication: father at bedside  Disposition Plan: home once sepsis resolves    Consultants:   None    Procedures:   None   Antimicrobials:   Vanco and zosyn 10/12 -->   Subjective: Pain in the back.  Objective: Vitals:   07/19/17 1008 07/19/17 1330 07/19/17 2242 07/20/17 0438  BP:  130/74 128/64 136/78  Pulse:  96 (!) 102 93  Resp:  '20 20 20  ' Temp: 99.5 F (37.5 C) 98.7 F (37.1 C) 100.2 F (37.9 C) 98.6 F (37 C)  TempSrc: Oral Oral Oral Oral  SpO2:  97% 98% 98%  Weight:    82.8 kg (182 lb 8.7 oz)  Height:        Intake/Output Summary (Last 24 hours) at 07/20/17 1101 Last data filed at 07/20/17 0438  Gross per 24 hour  Intake          2451.67 ml  Output             1575 ml  Net           876.67 ml   Filed Weights   07/18/17 2239 07/19/17 0639 07/20/17 0438  Weight: 86.2 kg (190 lb) 74.2 kg (163 lb 9.3 oz) 82.8 kg (182 lb 8.7 oz)    Physical Exam   Constitutional: Appears well-developed and well-nourished. No distress.  CVS: RRR, S1/S2 + Pulmonary: Effort and breath sounds normal, no stridor, rhonchi, wheezes, rales.  Abdominal: Soft. BS +,  no distension, tenderness, rebound or guarding.  Musculoskeletal: No  edema or tenderness Lymphadenopathy: No lymphadenopathy noted, cervical, inguinal. Neuro: Alert. Able to move legs from side to side but is week to move legs against resistance  Skin: Skin is warm and dry. No rash noted. Not diaphoretic. No erythema. No pallor.  Psychiatric: Normal mood and affect. Behavior, judgment, thought content normal.     Data Reviewed: I have personally reviewed following labs and imaging studies  CBC:  Recent Labs Lab 07/18/17 2300 07/20/17 0448  WBC 14.4* 22.7*  NEUTROABS 12.8*  --   HGB 13.1 11.5*  HCT 38.9* 34.7*  MCV 81.4 82.6  PLT 252 841   Basic Metabolic Panel:  Recent Labs Lab 07/18/17 2318 07/20/17 0448  NA 137 141  K 3.5 3.7  CL 111 117*  CO2 18* 19*  GLUCOSE 98 109*  BUN 12 8  CREATININE 1.17 1.14  CALCIUM 9.2 8.1*   GFR: Estimated  Creatinine Clearance: 89.9 mL/min (by C-G formula based on SCr of 1.14 mg/dL). Liver Function Tests:  Recent Labs Lab 07/18/17 2318 07/20/17 0448  AST 22 16  ALT 18 16*  ALKPHOS 92 93  BILITOT 1.2 0.9  PROT 8.2* 6.8  ALBUMIN 4.1 3.4*   No results for input(s): LIPASE, AMYLASE in the last 168 hours. No results for input(s): AMMONIA in the last 168 hours. Coagulation Profile:  Recent Labs Lab 07/18/17 2318  INR 1.19   Cardiac Enzymes: No results for input(s): CKTOTAL, CKMB, CKMBINDEX, TROPONINI in the last 168 hours. BNP (last 3 results) No results for input(s): PROBNP in the last 8760 hours. HbA1C: No results for input(s): HGBA1C in the last 72 hours. CBG: No results for input(s): GLUCAP in the last 168 hours. Lipid Profile: No results for input(s): CHOL, HDL, LDLCALC, TRIG, CHOLHDL, LDLDIRECT in the last 72  hours. Thyroid Function Tests: No results for input(s): TSH, T4TOTAL, FREET4, T3FREE, THYROIDAB in the last 72 hours. Anemia Panel: No results for input(s): VITAMINB12, FOLATE, FERRITIN, TIBC, IRON, RETICCTPCT in the last 72 hours. Urine analysis:    Component Value Date/Time   COLORURINE YELLOW 07/18/2017 2329   APPEARANCEUR CLOUDY (A) 07/18/2017 2329   LABSPEC 1.015 07/18/2017 2329   PHURINE 8.0 07/18/2017 2329   GLUCOSEU NEGATIVE 07/18/2017 2329   HGBUR TRACE (A) 07/18/2017 2329   BILIRUBINUR NEGATIVE 07/18/2017 2329   KETONESUR >80 (A) 07/18/2017 2329   PROTEINUR NEGATIVE 07/18/2017 2329   UROBILINOGEN 1.0 06/11/2008 1000   NITRITE POSITIVE (A) 07/18/2017 2329   LEUKOCYTESUR TRACE (A) 07/18/2017 2329   Sepsis Labs: '@LABRCNTIP' (procalcitonin:4,lacticidven:4)   )No results found for this or any previous visit (from the past 240 hour(s)).    Radiology Studies: Dg Chest 2 View  Result Date: 07/19/2017 CLINICAL DATA:  Low back pain.  Urinary retention. EXAM: CHEST  2 VIEW COMPARISON:  03/15/2008 FINDINGS: Shallow inspiration. The lungs are clear. Pulmonary vasculature is normal. No pleural effusions. Hilar, mediastinal and cardiac contours are unremarkable. Tubing superimposes the left hemithorax, likely ventriculoperitoneal shunt. IMPRESSION: No active cardiopulmonary disease. Electronically Signed   By: Andreas Newport M.D.   On: 07/19/2017 00:48   Willie Cherry Contrast  Result Date: 07/19/2017 CLINICAL DATA:  26 year old male with headache. Chiari malformation, ventricular shunt. EXAM: MRI HEAD WITHOUT CONTRAST TECHNIQUE: Multiplanar, multiecho pulse sequences of the brain and surrounding structures were obtained without intravenous contrast. COMPARISON:  Head CT without contrast 03/15/2008. 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 Left vertex approach ventriculostomy catheter course in position  appears stable since 2009. No ventriculomegaly. No extra-axial collection. No restricted diffusion to suggest acute infarction. No midline shift, mass effect, evidence of mass lesion, or acute intracranial hemorrhage. Pituitary within normal limits. Cervicomedullary junction appears stable compared to the prior CT. No abnormal gray or white matter signal aside from mild T2 and FLAIR hyperintensity probably representing gliosis along the course of the current, and up prior contralateral, ventriculostomy shunt. No chronic cerebral blood products. Vascular: Major intracranial vascular flow voids are preserved. Skull and upper cervical spine: Hyperostosis of the calvarium. Skullbase and cervicomedullary findings in keeping with Chiari 2. Otherwise negative visible cervical spine. Sinuses/Orbits: Negative orbits soft tissues. Visualized paranasal sinuses and mastoids are stable and well pneumatized. Other: Left scalp CSF reservoir and visible shunt tubing in the left posterior scalp are within normal limits. IMPRESSION: 1.  No acute intracranial abnormality. 2. Stigmata of Chiari 2 malformation with stable  appearance of the brain and left-side CSF shunt compared to a 2009 head CT. Electronically Signed   By: Genevie Ann M.D.   On: 07/19/2017 17:54      Scheduled Meds: . baclofen  20 mg Oral BID  . celecoxib  100 mg Oral BID  . DULoxetine  60 mg Oral Daily  . enoxaparin (LOVENOX) injection  40 mg Subcutaneous Q24H  . lisinopril  20 mg Oral Daily  . oxybutynin  15 mg Oral Daily  . pregabalin  75 mg Oral TID  . saccharomyces boulardii  250 mg Oral BID  . topiramate  50 mg Oral BID   Continuous Infusions: . piperacillin-tazobactam (ZOSYN)  IV 3.375 g (07/20/17 0823)  . vancomycin Stopped (07/20/17 0131)     LOS: 1 day    Time spent: 25 minutes  Greater than 50% of the time spent on counseling and coordinating the care.   Leisa Lenz, MD [facility] Pager [phone]  If 7PM-7AM, please  contact night-coverage [email or web address] Password [email or web address] 07/20/2017, 11:01 AM

## 2017-07-21 DIAGNOSIS — A4151 Sepsis due to Escherichia coli [E. coli]: Principal | ICD-10-CM

## 2017-07-21 LAB — CBC
HCT: 35.4 % — ABNORMAL LOW (ref 39.0–52.0)
Hemoglobin: 11.8 g/dL — ABNORMAL LOW (ref 13.0–17.0)
MCH: 27.4 pg (ref 26.0–34.0)
MCHC: 33.3 g/dL (ref 30.0–36.0)
MCV: 82.3 fL (ref 78.0–100.0)
PLATELETS: 191 10*3/uL (ref 150–400)
RBC: 4.3 MIL/uL (ref 4.22–5.81)
RDW: 15.9 % — ABNORMAL HIGH (ref 11.5–15.5)
WBC: 13.5 10*3/uL — ABNORMAL HIGH (ref 4.0–10.5)

## 2017-07-21 LAB — BASIC METABOLIC PANEL
Anion gap: 7 (ref 5–15)
BUN: 6 mg/dL (ref 6–20)
CHLORIDE: 112 mmol/L — AB (ref 101–111)
CO2: 21 mmol/L — ABNORMAL LOW (ref 22–32)
CREATININE: 0.97 mg/dL (ref 0.61–1.24)
Calcium: 8.4 mg/dL — ABNORMAL LOW (ref 8.9–10.3)
GFR calc Af Amer: >60 mL/min (ref >60)
GLUCOSE: 114 mg/dL — AB (ref 65–99)
POTASSIUM: 3.6 mmol/L (ref 3.5–5.1)
Sodium: 140 mmol/L (ref 135–145)

## 2017-07-21 MED ORDER — OXYCODONE HCL 5 MG PO TABS
5.0000 mg | ORAL_TABLET | ORAL | Status: DC | PRN
  Administered 2017-07-21 – 2017-07-22 (×6): 10 mg via ORAL
  Filled 2017-07-21 (×6): qty 2

## 2017-07-21 MED ORDER — KETOROLAC TROMETHAMINE 30 MG/ML IJ SOLN
30.0000 mg | Freq: Four times a day (QID) | INTRAMUSCULAR | Status: DC
  Administered 2017-07-21 – 2017-07-22 (×4): 30 mg via INTRAVENOUS
  Filled 2017-07-21 (×4): qty 1

## 2017-07-21 MED ORDER — CELECOXIB 100 MG PO CAPS: 100.0000 mg | ORAL_CAPSULE | Freq: Two times a day (BID) | ORAL | Status: DC

## 2017-07-21 NOTE — Progress Notes (Signed)
Patient ID: Willie Cherry, male   DOB: Aug 15, 1991, 27 y.o.   MRN: [id number]  PROGRESS NOTE    Willie Cherry  QZE:[id number] DOB: 07-Aug-1991 DOA: 07/18/2017  PCP: System, Pcp Not In   Brief Narrative:  26 year old male with spina bifida, neurogenic bladder, chronic back pain. Pt presented to ED with fever and found to have UTI. He was started on empiric abx while awaiting culture results.   Assessment & Plan:   Active Problems: Sepsis due to E coli UTI secondary to neurogenic bladder / Leukocytosis  - Sepsis criteria met on admission with fever, tachycardia, tachypnea, leukocytosis - Lactic acid is within normal limits - Urinalysis showed leukocytes - Chest x-ray showed no acute cardiopulmonary findings - Urine culture grew Escherichia coli - Blood cultures negative so far - Patient currently on vancomycin and Zosyn - White blood cell count trending down  Headaches - MRI brain showed CSF shunt otherwise normal appearence    Essential hypertension - Continue lisinopril  Lower back pain / History of spina bifida - Continue pain management efforts   DVT prophylaxis: Lovenox subcutaneous Code Status: full code  Family Communication: updated father at the bedside daily Disposition Plan: home likely in next 24-48 hours   Consultants:   None   Procedures:  None   Antimicrobials:   Vancomycin and Zosyn 07/18/2017 -->   Subjective: Pain in the back  Objective: Vitals:   07/20/17 0438 07/20/17 1400 07/20/17 2237 07/21/17 0531  BP: 136/78 129/77 (!) 147/75 126/73  Pulse: 93 74 66 69  Resp: _0 Temp: 98.6 F (37 C) 97.7 F (36.5 C) 98 F (36.7 C) 98 F (36.7 C)  TempSrc: Oral Oral Oral Oral  SpO2: 98% 98% 96% 96%  Weight: 82.8 kg (182 lb 8.7 oz)     Height:        Intake/Output Summary (Last 24 hours) at 07/21/17 0836 Last data filed at 07/21/17 0531  Gross per 24 hour  Intake          1591.67 ml  Output             2325 ml  Net           -733.33 ml   Filed Weights   07/18/17 2239 07/19/17 0639 07/20/17 0438  Weight: 86.2 kg (190 lb) 74.2 kg (163 lb 9.3 oz) 82.8 kg (182 lb 8.7 oz)    Examination:  General exam: Appears calm and comfortable  Respiratory system: Clear to auscultation. Respiratory effort normal. Cardiovascular system: S1 & S2 heard, RRR. No JVD, murmurs, rubs, gallops or clicks. No pedal edema. Gastrointestinal system: Abdomen is nondistended, soft and nontender. No organomegaly or masses felt. Normal bowel sounds heard. Central nervous system: Alert and oriented. No focal neurological deficits. Extremities: Symmetric 5 x 5 power. Skin: No rashes, lesions or ulcers Psychiatry: Judgement and insight appear normal. Mood & affect appropriate.   Data Reviewed: I have personally reviewed following labs and imaging studies  CBC:  Recent Labs Lab 07/18/17 2300 07/20/17 0448 07/21/17 0516  WBC 14.4* 22.7* 13.5*  NEUTROABS 12.8*  --   --   HGB 13.1 11.5* 11.8*  HCT 38.9* 34.7* 35.4*  MCV 81.4 82.6 82.3  PLT 252 195 226   Basic Metabolic Panel:  Recent Labs Lab 07/18/17 2318 07/20/17 0448 07/21/17 0516  NA 137 141 140  K 3.5 3.7 3.6  CL 111 117* 112*  CO2 18* 19* 21*  GLUCOSE 98 109* 114*  BUN _0 CREATININE 1.17 1.14 0.97  CALCIUM 9.2 8.1* 8.4*   GFR: Estimated Creatinine Clearance: 105.7 mL/min (by C-G formula based on SCr of 0.97 mg/dL). Liver Function Tests:  Recent Labs Lab 07/18/17 2318 07/20/17 0448  AST 22 16  ALT 18 16*  ALKPHOS 92 93  BILITOT 1.2 0.9  PROT 8.2* 6.8  ALBUMIN 4.1 3.4*   No results for input(s): LIPASE, AMYLASE in the last 168 hours. No results for input(s): AMMONIA in the last 168 hours. Coagulation Profile:  Recent Labs Lab 07/18/17 2318  INR 1.19   Cardiac Enzymes: No results for input(s): CKTOTAL, CKMB, CKMBINDEX, TROPONINI in the last 168 hours. BNP (last 3 results) No results for input(s): PROBNP in the last 8760 hours. HbA1C: No  results for input(s): HGBA1C in the last 72 hours. CBG: No results for input(s): GLUCAP in the last 168 hours. Lipid Profile: No results for input(s): CHOL, HDL, LDLCALC, TRIG, CHOLHDL, LDLDIRECT in the last 72 hours. Thyroid Function Tests: No results for input(s): TSH, T4TOTAL, FREET4, T3FREE, THYROIDAB in the last 72 hours. Anemia Panel: No results for input(s): VITAMINB12, FOLATE, FERRITIN, TIBC, IRON, RETICCTPCT in the last 72 hours. Urine analysis:    Component Value Date/Time   COLORURINE YELLOW 07/18/2017 2329   APPEARANCEUR CLOUDY (A) 07/18/2017 2329   LABSPEC 1.015 07/18/2017 2329   PHURINE 8.0 07/18/2017 2329   GLUCOSEU NEGATIVE 07/18/2017 2329   HGBUR TRACE (A) 07/18/2017 2329   BILIRUBINUR NEGATIVE 07/18/2017 2329   KETONESUR >80 (A) 07/18/2017 2329   PROTEINUR NEGATIVE 07/18/2017 2329   UROBILINOGEN 1.0 06/11/2008 1000   NITRITE POSITIVE (A) 07/18/2017 2329   LEUKOCYTESUR TRACE (A) 07/18/2017 2329   Sepsis Labs: _1 (procalcitonin:4,lacticidven:4)   ) Recent Results (from the past 240 hour(s))  Culture, blood (Routine x 2)     Status: None (Preliminary result)   Collection Time: 07/18/17 10:57 PM  Result Value Ref Range Status   Specimen Description BLOOD RIGHT ANTECUBITAL  Final   Special Requests   Final    BOTTLES DRAWN AEROBIC AND ANAEROBIC Blood Culture adequate volume   Culture   Final    NO GROWTH 1 DAY Performed at [facility], [place] [address] [place], [place] [address]    Report Status PENDING  Incomplete  Culture, blood (Routine x 2)     Status: None (Preliminary result)   Collection Time: 07/18/17 11:05 PM  Result Value Ref Range Status   Specimen Description BLOOD LEFT ANTECUBITAL  Final   Special Requests   Final    BOTTLES DRAWN AEROBIC AND ANAEROBIC Blood Culture adequate volume   Culture   Final    NO GROWTH 1 DAY Performed at [facility], [place] [address], [place], [place] [address]    Report Status PENDING   Incomplete  Urine culture     Status: Abnormal (Preliminary result)   Collection Time: 07/18/17 11:29 PM  Result Value Ref Range Status   Specimen Description URINE, RANDOM  Final   Special Requests NONE  Final   Culture >=100,000 COLONIES/mL ESCHERICHIA COLI (A)  Final   Report Status PENDING  Incomplete      Radiology Studies: Dg Chest 2 View  Result Date: 07/19/2017 CLINICAL DATA:  Low back pain.  Urinary retention. EXAM: CHEST  2 VIEW COMPARISON:  03/15/2008 FINDINGS: Shallow inspiration. The lungs are clear. Pulmonary vasculature is normal. No pleural effusions. Hilar, mediastinal and cardiac contours are unremarkable. Tubing superimposes the left hemithorax, likely ventriculoperitoneal  shunt. IMPRESSION: No active cardiopulmonary disease. Electronically Signed   By: Andreas Newport M.D.   On: 07/19/2017 00:48   Mr Brain Wo Contrast  Result Date: 07/19/2017 CLINICAL DATA:  26 year old male with headache. Chiari malformation, ventricular shunt. EXAM: MRI HEAD WITHOUT CONTRAST TECHNIQUE: Multiplanar, multiecho pulse sequences of the brain and surrounding structures were obtained without intravenous contrast. COMPARISON:  Head CT without contrast 03/15/2008. 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 Left vertex approach ventriculostomy catheter course in position appears stable since 2009. No ventriculomegaly. No extra-axial collection. No restricted diffusion to suggest acute infarction. No midline shift, mass effect, evidence of mass lesion, or acute intracranial hemorrhage. Pituitary within normal limits. Cervicomedullary junction appears stable compared to the prior CT. No abnormal gray or white matter signal aside from mild T2 and FLAIR hyperintensity probably representing gliosis along the course of the current, and up prior contralateral, ventriculostomy shunt. No chronic cerebral blood products. Vascular:  Major intracranial vascular flow voids are preserved. Skull and upper cervical spine: Hyperostosis of the calvarium. Skullbase and cervicomedullary findings in keeping with Chiari 2. Otherwise negative visible cervical spine. Sinuses/Orbits: Negative orbits soft tissues. Visualized paranasal sinuses and mastoids are stable and well pneumatized. Other: Left scalp CSF reservoir and visible shunt tubing in the left posterior scalp are within normal limits. IMPRESSION: 1.  No acute intracranial abnormality. 2. 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 Electronically Signed   By: Genevie Ann M.D.   On: 07/19/2017 17:54     Scheduled Meds: . baclofen  20 mg Oral BID  . celecoxib  100 mg Oral BID  . DULoxetine  60 mg Oral Daily  . enoxaparin (LOVENOX) injection  40 mg Subcutaneous Q24H  . lisinopril  20 mg Oral Daily  . oxybutynin  15 mg Oral Daily  . pregabalin  75 mg Oral TID  . saccharomyces boulardii  250 mg Oral BID  . topiramate  50 mg Oral BID   Continuous Infusions: . piperacillin-tazobactam (ZOSYN)  IV 3.375 g (07/21/17 0818)  . vancomycin 1,000 mg (07/21/17 0008)     LOS: 2 days    Time spent: 25 minutes  Greater than 50% of the time spent on counseling and coordinating the care.   Leisa Lenz, MD [facility] Pager [phone]  If 7PM-7AM, please contact night-coverage [email or web address] Password [email or web address] 07/21/2017, 8:36 AM

## 2017-07-22 LAB — CBC
HEMATOCRIT: 35.7 % — AB (ref 39.0–52.0)
HEMOGLOBIN: 11.9 g/dL — AB (ref 13.0–17.0)
MCH: 27.4 pg (ref 26.0–34.0)
MCHC: 33.3 g/dL (ref 30.0–36.0)
MCV: 82.3 fL (ref 78.0–100.0)
Platelets: 233 10*3/uL (ref 150–400)
RBC: 4.34 MIL/uL (ref 4.22–5.81)
RDW: 15.8 % — ABNORMAL HIGH (ref 11.5–15.5)
WBC: 7.4 10*3/uL (ref 4.0–10.5)

## 2017-07-22 LAB — URINE CULTURE: Culture: >=100000 — AB

## 2017-07-22 LAB — BASIC METABOLIC PANEL
Anion gap: 5 (ref 5–15)
BUN: 7 mg/dL (ref 6–20)
CHLORIDE: 113 mmol/L — AB (ref 101–111)
CO2: 23 mmol/L (ref 22–32)
CREATININE: 1.14 mg/dL (ref 0.61–1.24)
Calcium: 8.7 mg/dL — ABNORMAL LOW (ref 8.9–10.3)
GFR calc non Af Amer: >60 mL/min (ref >60)
GLUCOSE: 88 mg/dL (ref 65–99)
Potassium: 3.6 mmol/L (ref 3.5–5.1)
Sodium: 141 mmol/L (ref 135–145)

## 2017-07-22 MED ORDER — SULFAMETHOXAZOLE-TRIMETHOPRIM 800-160 MG PO TABS: 1.0000 | ORAL_TABLET | Freq: Two times a day (BID) | ORAL | 0 refills | Status: AC

## 2017-07-22 MED ORDER — OXYCODONE HCL 5 MG PO TABS: 5.0000 mg | ORAL_TABLET | ORAL | 0 refills | Status: AC | PRN

## 2017-07-22 MED ORDER — SACCHAROMYCES BOULARDII 250 MG PO CAPS: 250.0000 mg | ORAL_CAPSULE | Freq: Two times a day (BID) | ORAL | 0 refills | Status: AC

## 2017-07-22 NOTE — Discharge Summary (Signed)
Physician Discharge Summary  Willie Cherry JIR:[id number] DOB: 1991/02/17 DOA: 07/18/2017  PCP: System, Pcp Not In  Admit date: 07/18/2017 Discharge date: 07/22/2017  Recommendations for Outpatient Follow-up:  1. Continue Bactrim for 3 days on discharge   Discharge Diagnoses:  Active Problems:   Sepsis [facility])    Discharge Condition: stable   Diet recommendation: as tolerated   History of present illness:  26 year old male with spina bifida, neurogenic bladder, chronic back pain. Pt presented to ED with fever and found to have UTI. He was started on empiric abx while awaiting culture results.  Hospital Course:   Active Problems: Sepsis due to E coli UTI secondary to neurogenic bladder / Leukocytosis  - Sepsis criteria met on admission with fever, tachycardia, tachypnea, leukocytosis - Lactic acid is within normal limits - Urinalysis showed leukocytes - Chest x-ray showed no acute cardiopulmonary findings - Urine culture grew Escherichia coli - Blood cultures negative so far - Pt on vanco and zosyn; stop today - Continue bactrim for 3 days on discharge   Headaches - MRI brain showed CSF shunt otherwise normal appearence   Essential hypertension - Continue Lisinopril   Lower back pain / History of spina bifida - Much better this am   DVT prophylaxis: Lovenox subQ Code Status: full code  Family Communication: family not at the bedside this am    Consultants:   None   Procedures:  None   Antimicrobials:   Vancomycin and Zosyn 07/18/2017 --> 07/22/2017   Signed:  Leisa Lenz, MD  [facility] 07/22/2017, 9:59 AM  Pager #: [phone]  Time spent in minutes: more than 30 minutes    Discharge Exam: Vitals:   07/21/17 2145 07/22/17 0521  BP: 134/72 (!) 150/101  Pulse: 64 67  Resp: 16 16  Temp: 97.9 F (36.6 C) 98.3 F (36.8 C)  SpO2: 98% 99%   Vitals:   07/21/17 1330 07/21/17 2145 07/22/17 0500 07/22/17 0521  BP: 116/67  134/72  (!) 150/101  Pulse: 74 64  67  Resp: '15 16  16  ' Temp: 97.9 F (36.6 C) 97.9 F (36.6 C)  98.3 F (36.8 C)  TempSrc: Oral Oral  Oral  SpO2: 96% 98%  99%  Weight:   82.6 kg (182 lb 1.6 oz)   Height:        General: Pt is alert, follows commands appropriately, not in acute distress Cardiovascular: Regular rate and rhythm, S1/S2 +, no murmurs Respiratory: Clear to auscultation bilaterally, no wheezing, no crackles, no rhonchi Abdominal: Soft, non tender, non distended, bowel sounds +, no guarding Extremities: no edema, no cyanosis, pulses palpable bilaterally DP and PT Neuro: Grossly nonfocal  Discharge Instructions  Discharge Instructions    Call MD for:  persistant nausea and vomiting    Complete by:  As directed    Call MD for:  redness, tenderness, or signs of infection (pain, swelling, redness, odor or green/yellow discharge around incision site)    Complete by:  As directed    Call MD for:  severe uncontrolled pain    Complete by:  As directed    Diet - low sodium heart healthy    Complete by:  As directed    Discharge instructions    Complete by:  As directed    Continue Bactrim for 3 days.   Increase activity slowly    Complete by:  As directed      Allergies as of 07/22/2017      Reactions  Ibuprofen    Pt states he cannot take this because it interacts adversely with his regular meds.    Latex    Tylenol [acetaminophen]    Pt states he cannot take this because it interacts adversely with his regular meds.       Medication List    TAKE these medications   baclofen 20 MG tablet Commonly known as:  LIORESAL Take 20 mg by mouth 2 (two) times daily.   celecoxib 100 MG capsule Commonly known as:  CELEBREX Take 100 mg by mouth daily.   DULoxetine 60 MG capsule Commonly known as:  CYMBALTA Take 60 mg by mouth daily.   lisinopril 40 MG tablet Commonly known as:  PRINIVIL,ZESTRIL Take 40 mg by mouth daily.   oxybutynin 15 MG 24 hr  tablet Commonly known as:  DITROPAN XL Take 15 mg by mouth daily.   oxyCODONE 5 MG immediate release tablet Commonly known as:  Oxy IR/ROXICODONE Take 1-2 tablets (5-10 mg total) by mouth every 3 (three) hours as needed for moderate pain.   pregabalin 75 MG capsule Commonly known as:  LYRICA Take 75 mg by mouth 3 (three) times daily.   saccharomyces boulardii 250 MG capsule Commonly known as:  FLORASTOR Take 1 capsule (250 mg total) by mouth 2 (two) times daily.   sildenafil 50 MG tablet Commonly known as:  VIAGRA Take 50 mg by mouth daily as needed for erectile dysfunction.   sulfamethoxazole-trimethoprim 800-160 MG tablet Commonly known as:  BACTRIM DS,SEPTRA DS Take 1 tablet by mouth 2 (two) times daily.   topiramate 50 MG tablet Commonly known as:  TOPAMAX Take 50 mg by mouth 2 (two) times daily.         The results of significant diagnostics from this hospitalization (including imaging, microbiology, ancillary and laboratory) are listed below for reference.    Significant Diagnostic Studies: Dg Chest 2 View  Result Date: 07/19/2017 CLINICAL DATA:  Low back pain.  Urinary retention. EXAM: CHEST  2 VIEW COMPARISON:  03/15/2008 FINDINGS: Shallow inspiration. The lungs are clear. Pulmonary vasculature is normal. No pleural effusions. Hilar, mediastinal and cardiac contours are unremarkable. Tubing superimposes the left hemithorax, likely ventriculoperitoneal shunt. IMPRESSION: No active cardiopulmonary disease. Electronically Signed   By: Andreas Newport M.D.   On: 07/19/2017 00:48   Mr Brain Wo Contrast  Result Date: 07/19/2017 CLINICAL DATA:  26 year old male with headache. Chiari malformation, ventricular shunt. EXAM: MRI HEAD WITHOUT CONTRAST TECHNIQUE: Multiplanar, multiecho pulse sequences of the brain and surrounding structures were obtained without intravenous contrast. COMPARISON:  Head CT without contrast 03/15/2008. 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 Left vertex approach ventriculostomy catheter course in position appears stable since 2009. No ventriculomegaly. No extra-axial collection. No restricted diffusion to suggest acute infarction. No midline shift, mass effect, evidence of mass lesion, or acute intracranial hemorrhage. Pituitary within normal limits. Cervicomedullary junction appears stable compared to the prior CT. No abnormal gray or white matter signal aside from mild T2 and FLAIR hyperintensity probably representing gliosis along the course of the current, and up prior contralateral, ventriculostomy shunt. No chronic cerebral blood products. Vascular: Major intracranial vascular flow voids are preserved. Skull and upper cervical spine: Hyperostosis of the calvarium. Skullbase and cervicomedullary findings in keeping with Chiari 2. Otherwise negative visible cervical spine. Sinuses/Orbits: Negative orbits soft tissues. Visualized paranasal sinuses and mastoids are stable and well pneumatized. Other: Left scalp CSF reservoir and visible shunt tubing  in the left posterior scalp are within normal limits. IMPRESSION: 1.  No acute intracranial abnormality. 2. 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 Electronically Signed   By: Genevie Ann M.D.   On: 07/19/2017 17:54    Microbiology: Recent Results (from the past 240 hour(s))  Culture, blood (Routine x 2)     Status: None (Preliminary result)   Collection Time: 07/18/17 10:57 PM  Result Value Ref Range Status   Specimen Description BLOOD RIGHT ANTECUBITAL  Final   Special Requests   Final    BOTTLES DRAWN AEROBIC AND ANAEROBIC Blood Culture adequate volume   Culture   Final    NO GROWTH 2 DAYS Performed at [facility], [address], [place], [place] [address]    Report Status PENDING  Incomplete  Culture, blood (Routine x  2)     Status: None (Preliminary result)   Collection Time: 07/18/17 11:05 PM  Result Value Ref Range Status   Specimen Description BLOOD LEFT ANTECUBITAL  Final   Special Requests   Final    BOTTLES DRAWN AEROBIC AND ANAEROBIC Blood Culture adequate volume   Culture   Final    NO GROWTH 2 DAYS Performed at [facility], [place] [address], [place], [place] [address]    Report Status PENDING  Incomplete  Urine culture     Status: Abnormal   Collection Time: 07/18/17 11:29 PM  Result Value Ref Range Status   Specimen Description URINE, RANDOM  Final   Special Requests NONE  Final   Culture (A)  Final    >=100,000 COLONIES/mL ESCHERICHIA COLI 70,000 COLONIES/mL KLEBSIELLA PNEUMONIAE    Report Status 07/22/2017 FINAL  Final   Organism ID, Bacteria ESCHERICHIA COLI (A)  Final   Organism ID, Bacteria KLEBSIELLA PNEUMONIAE (A)  Final      Susceptibility   Escherichia coli - MIC*    AMPICILLIN 8 SENSITIVE Sensitive     CEFAZOLIN <=4 SENSITIVE Sensitive     CEFTRIAXONE <=1 SENSITIVE Sensitive     CIPROFLOXACIN <=0.25 SENSITIVE Sensitive     GENTAMICIN <=1 SENSITIVE Sensitive     IMIPENEM <=0.25 SENSITIVE Sensitive     NITROFURANTOIN <=16 SENSITIVE Sensitive     TRIMETH/SULFA <=20 SENSITIVE Sensitive     AMPICILLIN/SULBACTAM 4 SENSITIVE Sensitive     PIP/TAZO <=4 SENSITIVE Sensitive     Extended ESBL NEGATIVE Sensitive     * >=100,000 COLONIES/mL ESCHERICHIA COLI   Klebsiella pneumoniae - MIC*    AMPICILLIN >=32 RESISTANT Resistant     CEFAZOLIN <=4 SENSITIVE Sensitive     CEFTRIAXONE <=1 SENSITIVE Sensitive     CIPROFLOXACIN <=0.25 SENSITIVE Sensitive     GENTAMICIN <=1 SENSITIVE Sensitive     IMIPENEM <=0.25 SENSITIVE Sensitive     NITROFURANTOIN 64 INTERMEDIATE Intermediate     TRIMETH/SULFA <=20 SENSITIVE Sensitive     AMPICILLIN/SULBACTAM 8 SENSITIVE Sensitive     PIP/TAZO <=4 SENSITIVE Sensitive     Extended ESBL NEGATIVE Sensitive     * 70,000 COLONIES/mL KLEBSIELLA  PNEUMONIAE     Labs: Basic Metabolic Panel:  Recent Labs Lab 07/18/17 2318 07/20/17 0448 07/21/17 0516 07/22/17 0449  NA 137 141 140 141  K 3.5 3.7 3.6 3.6  CL 111 117* 112* 113*  CO2 18* 19* 21* 23  GLUCOSE 98 109* 114* 88  BUN '12 8 6 7  ' CREATININE 1.17 1.14 0.97 1.14  CALCIUM  9.2 8.1* 8.4* 8.7*   Liver Function Tests:  Recent Labs Lab 07/18/17 2318 07/20/17 0448  AST 22 16  ALT 18 16*  ALKPHOS 92 93  BILITOT 1.2 0.9  PROT 8.2* 6.8  ALBUMIN 4.1 3.4*   No results for input(s): LIPASE, AMYLASE in the last 168 hours. No results for input(s): AMMONIA in the last 168 hours. CBC:  Recent Labs Lab 07/18/17 2300 07/20/17 0448 07/21/17 0516 07/22/17 0449  WBC 14.4* 22.7* 13.5* 7.4  NEUTROABS 12.8*  --   --   --   HGB 13.1 11.5* 11.8* 11.9*  HCT 38.9* 34.7* 35.4* 35.7*  MCV 81.4 82.6 82.3 82.3  PLT 252 195 191 233   Cardiac Enzymes: No results for input(s): CKTOTAL, CKMB, CKMBINDEX, TROPONINI in the last 168 hours. BNP: BNP (last 3 results) No results for input(s): BNP in the last 8760 hours.  ProBNP (last 3 results) No results for input(s): PROBNP in the last 8760 hours.  CBG: No results for input(s): GLUCAP in the last 168 hours.

## 2017-07-22 NOTE — Discharge Instructions (Signed)
Urinary Tract Infection, Adult °A urinary tract infection (UTI) is an infection of any part of the urinary tract. The urinary tract includes the: °· Kidneys. °· Ureters. °· Bladder. °· Urethra. ° °These organs make, store, and get rid of pee (urine) in the body. °Follow these instructions at home: °· Take over-the-counter and prescription medicines only as told by your doctor. °· If you were prescribed an antibiotic medicine, take it as told by your doctor. Do not stop taking the antibiotic even if you start to feel better. °· Avoid the following drinks: °? Alcohol. °? Caffeine. °? Tea. °? Carbonated drinks. °· Drink enough fluid to keep your pee clear or pale yellow. °· Keep all follow-up visits as told by your doctor. This is important. °· Make sure to: °? Empty your bladder often and completely. Do not to hold pee for long periods of time. °? Empty your bladder before and after sex. °? Wipe from front to back after a bowel movement if you are male. Use each tissue one time when you wipe. °Contact a doctor if: °· You have back pain. °· You have a fever. °· You feel sick to your stomach (nauseous). °· You throw up (vomit). °· Your symptoms do not get better after 3 days. °· Your symptoms go away and then come back. °Get help right away if: °· You have very bad back pain. °· You have very bad lower belly (abdominal) pain. °· You are throwing up and cannot keep down any medicines or water. °This information is not intended to replace advice given to you by your health care provider. Make sure you discuss any questions you have with your health care provider. °Document Released: 03/11/2008 Document Revised: 02/29/2016 Document Reviewed: 08/14/2015 °Elsevier Interactive Patient Education © 2018 [employer] ° °

## 2017-07-24 LAB — CULTURE, BLOOD (ROUTINE X 2)
CULTURE: NO GROWTH
CULTURE: NO GROWTH
SPECIAL REQUESTS: ADEQUATE
Special Requests: ADEQUATE

## 2018-12-28 IMAGING — MR MR HEAD W/O CM
10 of 12 series · 41 of 48 positions shown · non-contrast
Comparison: Head CT without contrast 03/15/2008.

CLINICAL DATA: 26-year-old male with headache. Chiari malformation,
ventricular shunt.

EXAM:
MRI HEAD WITHOUT CONTRAST
TECHNIQUE: Multiplanar, multiecho pulse sequences of the brain and surrounding
structures were obtained without intravenous contrast.

[Series 4: DWI · axial · 3.0mm · 1.09mm/px · z∈[-78,+96]mm · 9 of 120 slices shown (1 of 6)]
[im 1/120]
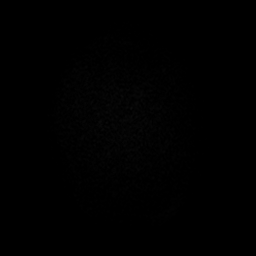
[im 15/120]
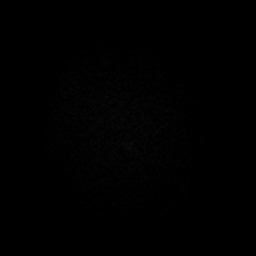
[im 30/120]
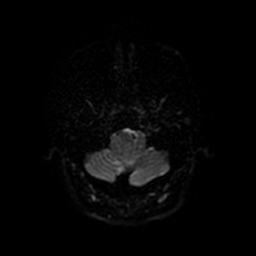
[im 45/120]
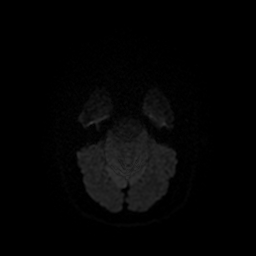
[im 60/120]
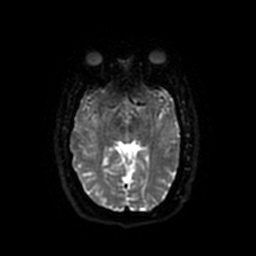
[im 75/120]
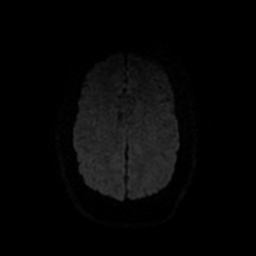
[im 90/120]
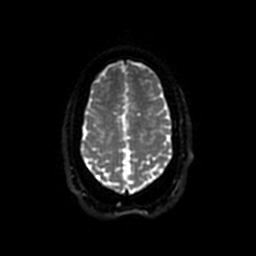
[im 105/120]
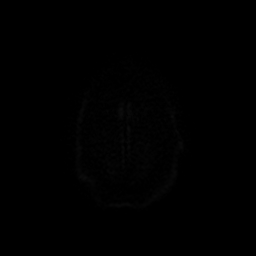
[im 120/120]
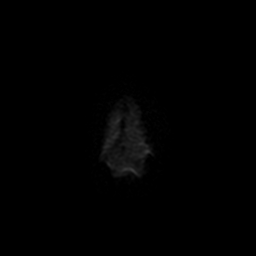

[Series 5: T1 · sagittal · 5.0mm · 0.47mm/px · 2 of 24 slices shown]
[im 1/24]
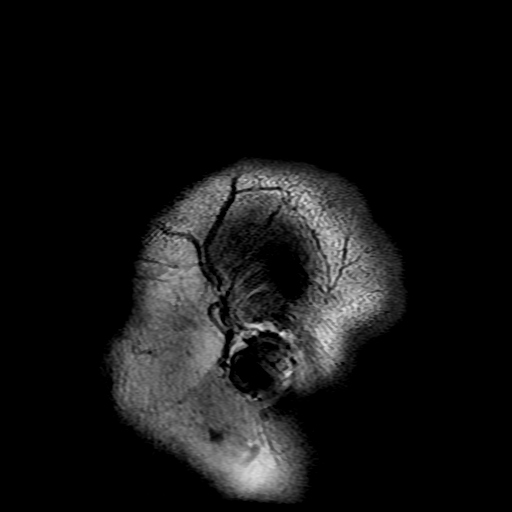
[im 24/24]
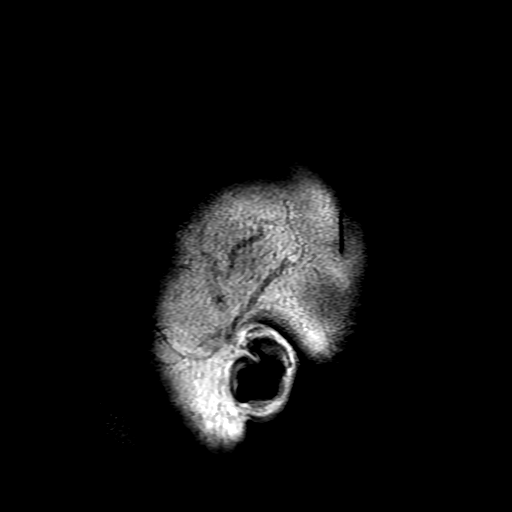

[Series 6: DWI · coronal · 5.0mm · 1.09mm/px · 7 of 80 slices shown (2 of 6)]
[im 1/80]
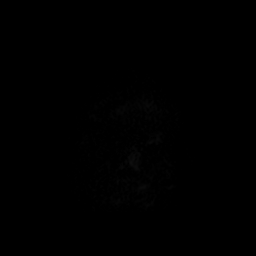
[im 14/80]
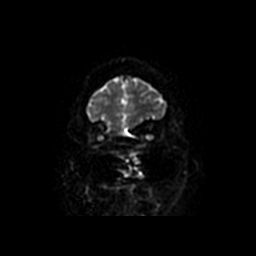
[im 27/80]
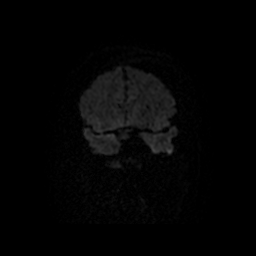
[im 40/80]
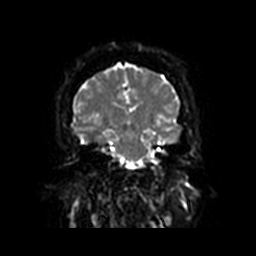
[im 53/80]
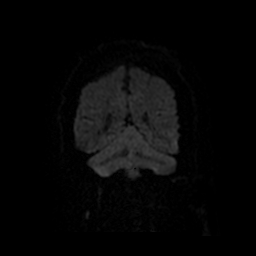
[im 66/80]
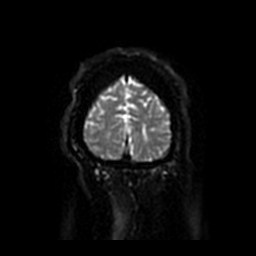
[im 80/80]
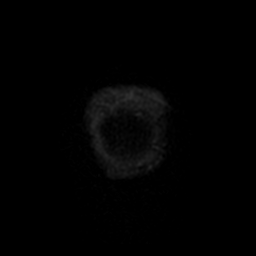

[Series 7: T2 · axial · 5.0mm · 0.43mm/px · z∈[-60,+97]mm · 2 of 24 slices shown (1 of 2)]
[im 1/24]
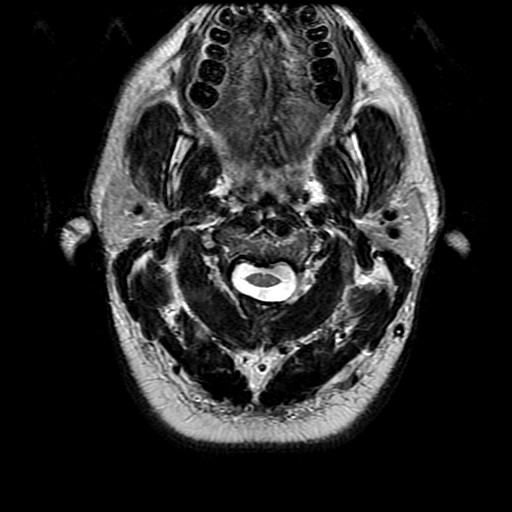
[im 24/24]
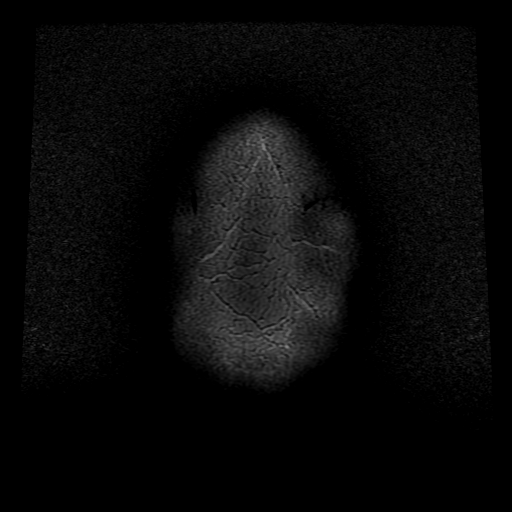

[Series 8: FLAIR · axial · 3.0mm · 0.43mm/px · z∈[-53,+100]mm · 2 of 27 slices shown]
[im 1/27]
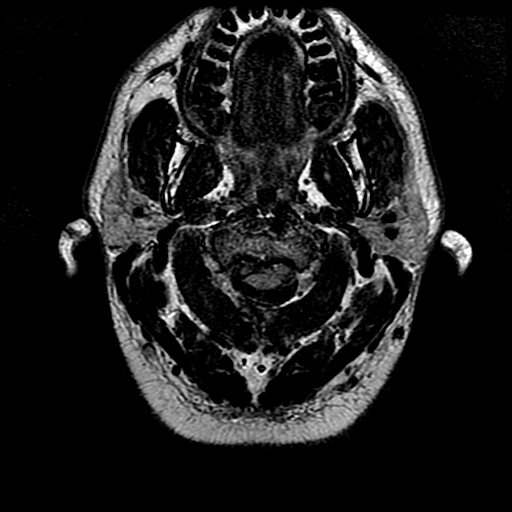
[im 27/27]
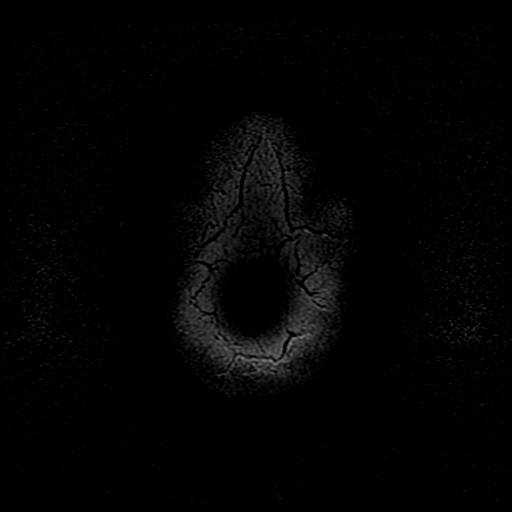

[Series 11: T2 · coronal · 5.0mm · 0.45mm/px · 3 of 31 slices shown (2 of 2)]
[im 1/31]
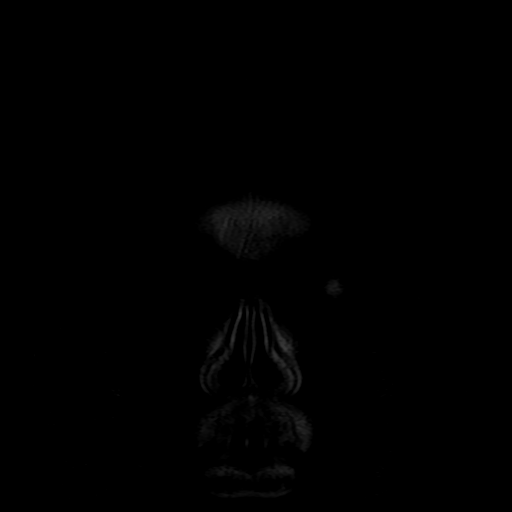
[im 16/31]
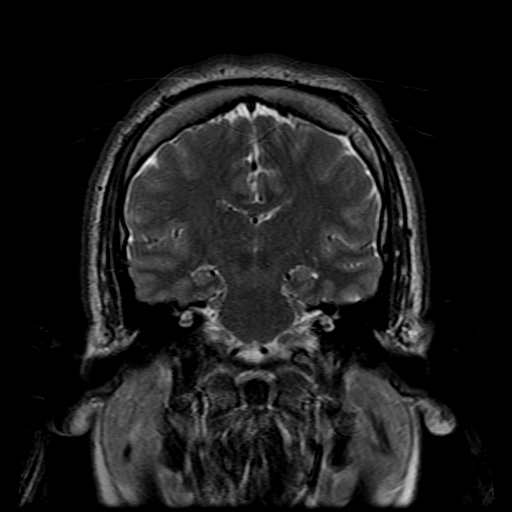
[im 31/31]
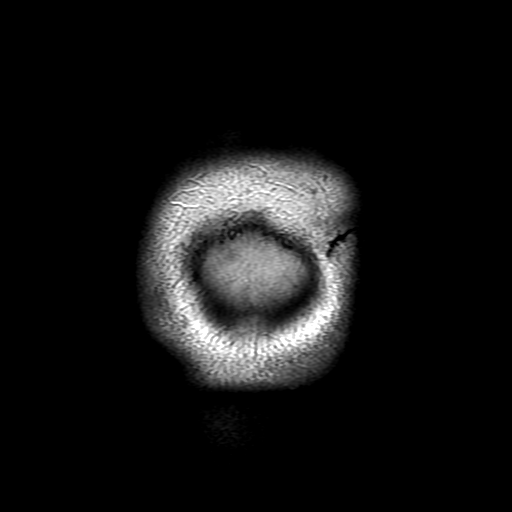

[Series 400: DWI · axial · 3.0mm · 1.09mm/px · z∈[-78,+96]mm · 5 of 60 slices shown (3 of 6)]
[im 1/60]
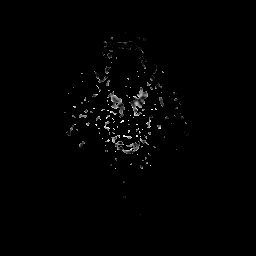
[im 15/60]
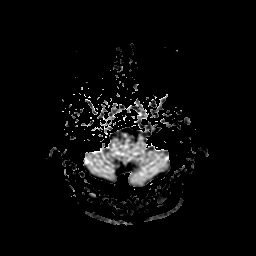
[im 30/60]
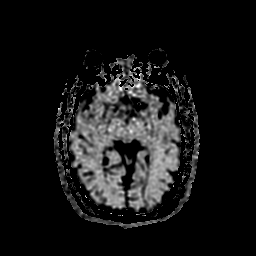
[im 45/60]
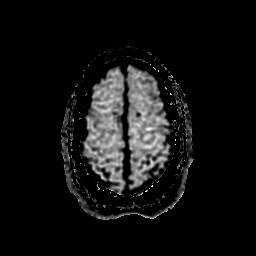
[im 60/60]
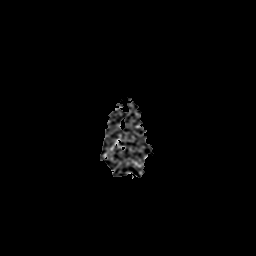

[Series 401: DWI · axial · 3.0mm · 1.09mm/px · z∈[-78,+96]mm · 5 of 60 slices shown (4 of 6)]
[im 1/60]
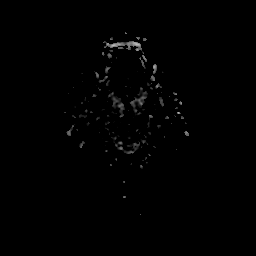
[im 15/60]
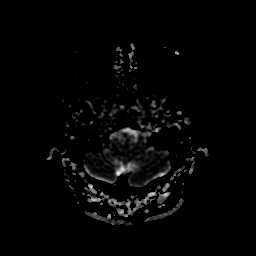
[im 30/60]
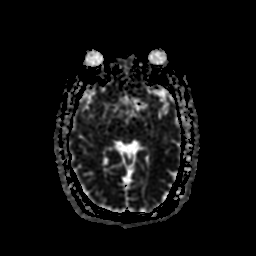
[im 45/60]
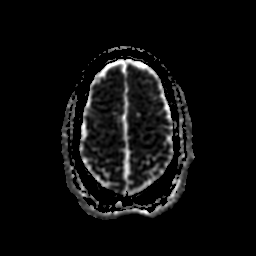
[im 60/60]
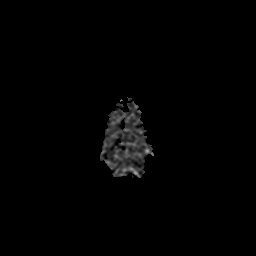

[Series 600: DWI · coronal · 5.0mm · 1.09mm/px · 3 of 40 slices shown (5 of 6)]
[im 1/40]
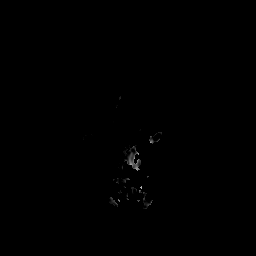
[im 20/40]
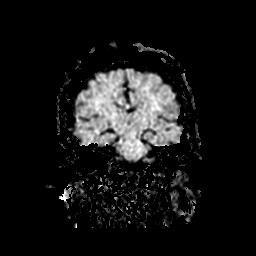
[im 40/40]
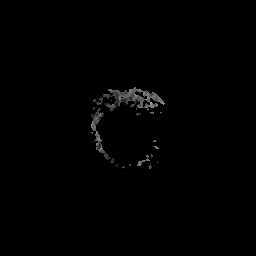

[Series 601: DWI · coronal · 5.0mm · 1.09mm/px · 3 of 40 slices shown (6 of 6)]
[im 1/40]
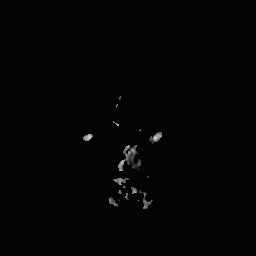
[im 20/40]
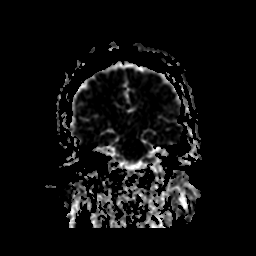
[im 40/40]
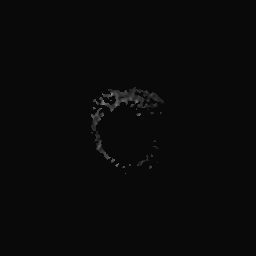

[41 of 48 positions shown; findings below may reference images not displayed]

FINDINGS: Brain: Stigmata of Chiari 2 malformation, including dysgenesis of
the corpus callosum. Cavum septum pellucidum. Left vertex approach
ventriculostomy catheter course in position appears stable since
1004. No ventriculomegaly. No extra-axial collection.

No restricted diffusion to suggest acute infarction. No midline
shift, mass effect, evidence of mass lesion, or acute intracranial
hemorrhage. Pituitary within normal limits. Cervicomedullary
junction appears stable compared to the prior CT.

No abnormal gray or white matter signal aside from mild T2 and FLAIR
hyperintensity probably representing gliosis along the course of the
current, and up prior contralateral, ventriculostomy shunt. No
chronic cerebral blood products.

Vascular: Major intracranial vascular flow voids are preserved.

Skull and upper cervical spine: Hyperostosis of the calvarium.
Skullbase and cervicomedullary findings in keeping with Chiari 2.
Otherwise negative visible cervical spine.

Sinuses/Orbits: Negative orbits soft tissues. Visualized paranasal
sinuses and mastoids are stable and well pneumatized.

Other: Left scalp CSF reservoir and visible shunt tubing in the left
posterior scalp are within normal limits.
IMPRESSION: 1.  No acute intracranial abnormality.
2. Stigmata of Chiari 2 malformation with stable appearance of the
brain and left-side CSF shunt compared to a 1004 head CT.

## 2019-11-29 ENCOUNTER — Ambulatory Visit: Payer: PRIVATE HEALTH INSURANCE
